# Patient Record
Sex: Female | Born: 1975 | Race: White | Hispanic: No | Marital: Married | Smoking: Former smoker
Health system: Southern US, Community
[De-identification: ages and names within clinical notes are randomized; demographics above are authoritative.]

## PROBLEM LIST (undated history)

## (undated) DIAGNOSIS — N951 Menopausal and female climacteric states: Secondary | ICD-10-CM

## (undated) HISTORY — DX: Menopausal and female climacteric states: N95.1

---
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
## 1998-04-14 ENCOUNTER — Emergency Department (HOSPITAL_COMMUNITY): Admission: EM | Admit: 1998-04-14 | Discharge: 1998-04-14 | Payer: Self-pay | Admitting: Emergency Medicine

## 1998-06-24 ENCOUNTER — Encounter: Admission: RE | Admit: 1998-06-24 | Discharge: 1998-09-22 | Payer: Self-pay | Admitting: Specialist

## 1998-11-18 ENCOUNTER — Ambulatory Visit (HOSPITAL_COMMUNITY): Admission: RE | Admit: 1998-11-18 | Discharge: 1998-11-18 | Payer: Self-pay | Admitting: Orthopedic Surgery

## 1998-11-18 ENCOUNTER — Encounter: Payer: Self-pay | Admitting: Orthopedic Surgery

## 2001-12-04 ENCOUNTER — Other Ambulatory Visit: Admission: RE | Admit: 2001-12-04 | Discharge: 2001-12-04 | Payer: Self-pay | Admitting: Obstetrics and Gynecology

## 2003-01-09 ENCOUNTER — Other Ambulatory Visit: Admission: RE | Admit: 2003-01-09 | Discharge: 2003-01-09 | Payer: Self-pay | Admitting: Obstetrics and Gynecology

## 2004-01-12 ENCOUNTER — Other Ambulatory Visit: Admission: RE | Admit: 2004-01-12 | Discharge: 2004-01-12 | Payer: Self-pay | Admitting: Obstetrics and Gynecology

## 2005-03-15 ENCOUNTER — Other Ambulatory Visit: Admission: RE | Admit: 2005-03-15 | Discharge: 2005-03-15 | Payer: Self-pay | Admitting: Obstetrics and Gynecology

## 2005-11-14 HISTORY — PX: NECK SURGERY: SHX720

## 2006-03-28 ENCOUNTER — Other Ambulatory Visit: Admission: RE | Admit: 2006-03-28 | Discharge: 2006-03-28 | Payer: Self-pay | Admitting: Obstetrics and Gynecology

## 2006-11-12 ENCOUNTER — Inpatient Hospital Stay (HOSPITAL_COMMUNITY): Admission: AD | Admit: 2006-11-12 | Discharge: 2006-11-12 | Payer: Self-pay | Admitting: Obstetrics and Gynecology

## 2006-11-13 ENCOUNTER — Encounter: Admission: RE | Admit: 2006-11-13 | Discharge: 2006-11-13 | Payer: Self-pay | Admitting: Orthopedic Surgery

## 2006-11-22 ENCOUNTER — Inpatient Hospital Stay (HOSPITAL_COMMUNITY): Admission: RE | Admit: 2006-11-22 | Discharge: 2006-11-24 | Payer: Self-pay | Admitting: Neurosurgery

## 2006-12-20 ENCOUNTER — Inpatient Hospital Stay (HOSPITAL_COMMUNITY): Admission: AD | Admit: 2006-12-20 | Discharge: 2006-12-21 | Payer: Self-pay | Admitting: Obstetrics and Gynecology

## 2006-12-29 ENCOUNTER — Inpatient Hospital Stay (HOSPITAL_COMMUNITY): Admission: AD | Admit: 2006-12-29 | Discharge: 2006-12-31 | Payer: Self-pay | Admitting: Obstetrics and Gynecology

## 2007-02-05 ENCOUNTER — Other Ambulatory Visit: Admission: RE | Admit: 2007-02-05 | Discharge: 2007-02-05 | Payer: Self-pay | Admitting: Obstetrics and Gynecology

## 2011-02-15 ENCOUNTER — Inpatient Hospital Stay (HOSPITAL_COMMUNITY)
Admission: AD | Admit: 2011-02-15 | Discharge: 2011-02-17 | DRG: 775 | Disposition: A | Discharge status: Home / Self Care | Payer: PRIVATE HEALTH INSURANCE | Source: Ambulatory Visit | Attending: Obstetrics & Gynecology | Admitting: Obstetrics & Gynecology

## 2011-02-15 DIAGNOSIS — O09529 Supervision of elderly multigravida, unspecified trimester: Secondary | ICD-10-CM | POA: Diagnosis present

## 2011-02-16 LAB — RPR: RPR Ser Ql: NONREACTIVE

## 2011-02-16 LAB — CBC
HCT: 37.4 % (ref 36.0–46.0)
Hemoglobin: 12.4 g/dL (ref 12.0–15.0)
MCH: 28.3 pg (ref 26.0–34.0)
MCHC: 33.2 g/dL (ref 30.0–36.0)
MCV: 85.4 fL (ref 78.0–100.0)
Platelets: 225 10*3/uL (ref 150–400)
RBC: 4.38 MIL/uL (ref 3.87–5.11)
RDW: 14.5 % (ref 11.5–15.5)
WBC: 9.5 10*3/uL (ref 4.0–10.5)

## 2011-02-17 LAB — CBC
MCH: 28 pg (ref 26.0–34.0)
MCHC: 32.1 g/dL (ref 30.0–36.0)
Platelets: 167 10*3/uL (ref 150–400)
RBC: 3.53 MIL/uL — ABNORMAL LOW (ref 3.87–5.11)

## 2012-08-02 ENCOUNTER — Other Ambulatory Visit: Payer: Self-pay | Admitting: Obstetrics and Gynecology

## 2014-01-20 ENCOUNTER — Other Ambulatory Visit (HOSPITAL_COMMUNITY)
Admission: RE | Admit: 2014-01-20 | Discharge: 2014-01-20 | Disposition: A | Discharge status: Home / Self Care | Payer: Commercial Managed Care - PPO | Source: Ambulatory Visit | Attending: Family Medicine | Admitting: Family Medicine

## 2014-01-20 ENCOUNTER — Other Ambulatory Visit: Payer: Self-pay | Admitting: Family Medicine

## 2014-01-20 DIAGNOSIS — Z1151 Encounter for screening for human papillomavirus (HPV): Secondary | ICD-10-CM | POA: Insufficient documentation

## 2014-01-20 DIAGNOSIS — Z01419 Encounter for gynecological examination (general) (routine) without abnormal findings: Secondary | ICD-10-CM | POA: Insufficient documentation

## 2016-02-04 ENCOUNTER — Ambulatory Visit: Payer: Commercial Managed Care - PPO | Admitting: Podiatry

## 2016-02-12 ENCOUNTER — Ambulatory Visit (INDEPENDENT_AMBULATORY_CARE_PROVIDER_SITE_OTHER): Payer: Commercial Managed Care - PPO

## 2016-02-12 ENCOUNTER — Encounter: Payer: Self-pay | Admitting: Podiatry

## 2016-02-12 ENCOUNTER — Ambulatory Visit (INDEPENDENT_AMBULATORY_CARE_PROVIDER_SITE_OTHER): Payer: Commercial Managed Care - PPO | Admitting: Podiatry

## 2016-02-12 ENCOUNTER — Ambulatory Visit: Payer: Self-pay

## 2016-02-12 ENCOUNTER — Other Ambulatory Visit: Payer: Self-pay | Admitting: Podiatry

## 2016-02-12 VITALS — BP 114/76 | HR 71 | Resp 16 | Ht 67.0 in | Wt 193.0 lb

## 2016-02-12 DIAGNOSIS — M25571 Pain in right ankle and joints of right foot: Secondary | ICD-10-CM | POA: Diagnosis not present

## 2016-02-12 DIAGNOSIS — B079 Viral wart, unspecified: Secondary | ICD-10-CM | POA: Diagnosis not present

## 2016-02-12 DIAGNOSIS — B078 Other viral warts: Secondary | ICD-10-CM

## 2016-02-12 DIAGNOSIS — M779 Enthesopathy, unspecified: Secondary | ICD-10-CM

## 2016-02-12 MED ORDER — TRIAMCINOLONE ACETONIDE 10 MG/ML IJ SUSP
10.0000 mg | Freq: Once | INTRAMUSCULAR | Status: AC
  Administered 2016-02-12: 10 mg

## 2016-02-12 NOTE — Progress Notes (Signed)
Subjective:     Patient ID: Christy Cooper, female   DOB: 10-01-1976, 40 y.o.   MRN: [id number]  HPI patient states she's having pain in the right ankle and lesion formation underneath the plantar second metatarsal right that she thinks is a wart from having had 1 previously   Review of Systems  All other systems reviewed and are negative.      Objective:   Physical Exam  Constitutional: She is oriented to person, place, and time.  Cardiovascular: Intact distal pulses.   Musculoskeletal: Normal range of motion.  Neurological: She is oriented to person, place, and time.  Skin: Skin is warm.  Nursing note and vitals reviewed.  neurovascular status intact muscle strength adequate range of motion within normal limits with patient found to have exquisite discomfort in the right ankle sinus tarsi with no loss of motion or inversion injury indications with a keratotic lesion plantar right that upon debridement shows pinpoint bleeding pain to lateral pressure. Patient's found to have good digital perfusion and is well oriented 3     Assessment:     Probable sinus tarsitis right and verruca plantaris plantar right    Plan:     H&P and both conditions reviewed with patient and x-rays reviewed. Today I carefully injected the sinus tarsi right 3 mg Kenalog 5 mg Xylocaine and went ahead and debrided the lesion right exposed the wart and apply chemical consisting of immune agent with sterile dressing. Reappoint to recheck again  X-ray report indicated that there is some spurring around the fibula that no indication of diastases or chronic arthritis of the ankle joint

## 2016-02-12 NOTE — Progress Notes (Signed)
   Subjective:    Patient ID: Christy Cooper, female    DOB: 1975/12/02, 40 y.o.   MRN: [id number]  HPI Patient presents with ankle pain in their right foot; both sides. Pt stated, "When runs about 1 mile, ankle starts to hurt"; x6-8 months.  Patient also presents with a wart on their right foot; plantar forefoot-below 2nd toe; x3-4 months.   Review of Systems  All other systems reviewed and are negative.      Objective:   Physical Exam        Assessment & Plan:

## 2016-03-11 ENCOUNTER — Ambulatory Visit: Payer: Commercial Managed Care - PPO | Admitting: Podiatry

## 2016-05-31 ENCOUNTER — Other Ambulatory Visit: Payer: Self-pay | Admitting: Family Medicine

## 2016-05-31 DIAGNOSIS — Z1231 Encounter for screening mammogram for malignant neoplasm of breast: Secondary | ICD-10-CM

## 2016-06-03 ENCOUNTER — Ambulatory Visit
Admission: RE | Admit: 2016-06-03 | Discharge: 2016-06-03 | Disposition: A | Discharge status: Home / Self Care | Payer: Commercial Managed Care - PPO | Source: Ambulatory Visit | Attending: Family Medicine | Admitting: Family Medicine

## 2016-06-03 DIAGNOSIS — Z1231 Encounter for screening mammogram for malignant neoplasm of breast: Secondary | ICD-10-CM

## 2017-01-13 ENCOUNTER — Ambulatory Visit (INDEPENDENT_AMBULATORY_CARE_PROVIDER_SITE_OTHER): Payer: Commercial Managed Care - PPO | Admitting: Podiatry

## 2017-01-13 ENCOUNTER — Encounter: Payer: Self-pay | Admitting: Podiatry

## 2017-01-13 DIAGNOSIS — M779 Enthesopathy, unspecified: Secondary | ICD-10-CM | POA: Diagnosis not present

## 2017-01-13 MED ORDER — TRIAMCINOLONE ACETONIDE 10 MG/ML IJ SUSP
10.0000 mg | Freq: Once | INTRAMUSCULAR | Status: AC
  Administered 2017-01-13: 10 mg

## 2017-01-15 NOTE — Progress Notes (Signed)
Subjective:     Patient ID: Christy SportsMargot Cooper, female   DOB: 1976/01/24, 41 y.o.   MRN: [id number]  HPI patient presents stating that she is getting pain again in her right ankle and that the medication we used gave her relief for at least 4 months   Review of Systems     Objective:   Physical Exam Neurovascular status intact negative Homans sign was noted with patient found to have exquisite discomfort in the sinus tarsi right with inflammation noted upon inversion    Assessment:     Sinus tarsitis right    Plan:     Instructed on injection therapy which were administered today 3 mg Kenalog 5 mg Xylocaine and gave instructions on reduced activity for several days and reappoint as needed

## 2017-09-01 ENCOUNTER — Telehealth: Payer: Self-pay | Admitting: Podiatry

## 2017-09-01 NOTE — Telephone Encounter (Signed)
Called pt back and left a voicemail to call me back in regards to the message she left this morning regarding medical records. Told her to call me directly at [phone] and that we are in the office until 4:00 pm today and will be back at 8:00 am on Monday.

## 2017-09-01 NOTE — Telephone Encounter (Signed)
Hi Shanda BumpsJessica, this is Financial controllerMargot. You just left a message about releasing my records to me via MyChart. That is okay. If you need to speak to me about anything else, you can reach me at [phone]. Thank you very much.

## 2017-09-01 NOTE — Telephone Encounter (Signed)
I am calling to request a copy of my last two office visit notes. If you can e-mail them to me, that would be great. My e-mail address is [email or web address] or you can call me back at [phone]. Thank you.

## 2018-02-02 ENCOUNTER — Other Ambulatory Visit: Payer: Self-pay | Admitting: Family Medicine

## 2018-02-02 DIAGNOSIS — Z139 Encounter for screening, unspecified: Secondary | ICD-10-CM

## 2018-02-20 ENCOUNTER — Ambulatory Visit: Payer: Commercial Managed Care - PPO

## 2018-03-12 ENCOUNTER — Other Ambulatory Visit: Payer: Self-pay | Admitting: Family Medicine

## 2018-03-12 DIAGNOSIS — N644 Mastodynia: Secondary | ICD-10-CM

## 2018-03-13 ENCOUNTER — Other Ambulatory Visit: Payer: Commercial Managed Care - PPO

## 2018-03-13 ENCOUNTER — Ambulatory Visit: Payer: Commercial Managed Care - PPO

## 2018-06-07 DIAGNOSIS — M25571 Pain in right ankle and joints of right foot: Secondary | ICD-10-CM | POA: Diagnosis not present

## 2018-06-07 DIAGNOSIS — L219 Seborrheic dermatitis, unspecified: Secondary | ICD-10-CM | POA: Diagnosis not present

## 2018-07-24 ENCOUNTER — Ambulatory Visit
Admission: RE | Admit: 2018-07-24 | Discharge: 2018-07-24 | Disposition: A | Discharge status: Home / Self Care | Payer: BLUE CROSS/BLUE SHIELD | Source: Ambulatory Visit | Attending: Family Medicine | Admitting: Family Medicine

## 2018-07-24 ENCOUNTER — Ambulatory Visit: Payer: Commercial Managed Care - PPO

## 2018-07-24 DIAGNOSIS — R922 Inconclusive mammogram: Secondary | ICD-10-CM | POA: Diagnosis not present

## 2018-07-24 DIAGNOSIS — N644 Mastodynia: Secondary | ICD-10-CM

## 2019-01-17 DIAGNOSIS — R509 Fever, unspecified: Secondary | ICD-10-CM | POA: Diagnosis not present

## 2019-01-17 DIAGNOSIS — J069 Acute upper respiratory infection, unspecified: Secondary | ICD-10-CM | POA: Diagnosis not present

## 2019-06-24 DIAGNOSIS — L989 Disorder of the skin and subcutaneous tissue, unspecified: Secondary | ICD-10-CM | POA: Diagnosis not present

## 2019-09-02 DIAGNOSIS — D225 Melanocytic nevi of trunk: Secondary | ICD-10-CM | POA: Diagnosis not present

## 2019-09-02 DIAGNOSIS — D485 Neoplasm of uncertain behavior of skin: Secondary | ICD-10-CM | POA: Diagnosis not present

## 2019-09-02 DIAGNOSIS — L739 Follicular disorder, unspecified: Secondary | ICD-10-CM | POA: Diagnosis not present

## 2019-09-13 DIAGNOSIS — M25531 Pain in right wrist: Secondary | ICD-10-CM | POA: Diagnosis not present

## 2019-09-13 DIAGNOSIS — Z6830 Body mass index (BMI) 30.0-30.9, adult: Secondary | ICD-10-CM | POA: Diagnosis not present

## 2019-09-13 DIAGNOSIS — M25571 Pain in right ankle and joints of right foot: Secondary | ICD-10-CM | POA: Diagnosis not present

## 2019-09-13 DIAGNOSIS — M7541 Impingement syndrome of right shoulder: Secondary | ICD-10-CM | POA: Diagnosis not present

## 2019-09-20 DIAGNOSIS — M25571 Pain in right ankle and joints of right foot: Secondary | ICD-10-CM | POA: Diagnosis not present

## 2020-03-16 ENCOUNTER — Other Ambulatory Visit (HOSPITAL_COMMUNITY)
Admission: RE | Admit: 2020-03-16 | Discharge: 2020-03-16 | Disposition: A | Discharge status: Home / Self Care | Payer: BC Managed Care – PPO | Source: Ambulatory Visit | Attending: Family Medicine | Admitting: Family Medicine

## 2020-03-16 ENCOUNTER — Other Ambulatory Visit: Payer: Self-pay | Admitting: Family Medicine

## 2020-03-16 DIAGNOSIS — Z124 Encounter for screening for malignant neoplasm of cervix: Secondary | ICD-10-CM | POA: Diagnosis not present

## 2020-03-16 DIAGNOSIS — Z0000 Encounter for general adult medical examination without abnormal findings: Secondary | ICD-10-CM | POA: Diagnosis not present

## 2020-03-17 LAB — CYTOLOGY - PAP
Comment: NEGATIVE
Diagnosis: NEGATIVE
High risk HPV: NEGATIVE

## 2020-04-06 DIAGNOSIS — L720 Epidermal cyst: Secondary | ICD-10-CM | POA: Diagnosis not present

## 2020-04-07 DIAGNOSIS — L720 Epidermal cyst: Secondary | ICD-10-CM | POA: Diagnosis not present

## 2020-05-11 DIAGNOSIS — M7918 Myalgia, other site: Secondary | ICD-10-CM | POA: Diagnosis not present

## 2020-08-20 DIAGNOSIS — S90122A Contusion of left lesser toe(s) without damage to nail, initial encounter: Secondary | ICD-10-CM | POA: Diagnosis not present

## 2020-10-01 DIAGNOSIS — N939 Abnormal uterine and vaginal bleeding, unspecified: Secondary | ICD-10-CM | POA: Diagnosis not present

## 2020-10-27 ENCOUNTER — Other Ambulatory Visit: Payer: Self-pay | Admitting: Family Medicine

## 2020-10-27 DIAGNOSIS — Z6826 Body mass index (BMI) 26.0-26.9, adult: Secondary | ICD-10-CM | POA: Diagnosis not present

## 2020-10-27 DIAGNOSIS — F17210 Nicotine dependence, cigarettes, uncomplicated: Secondary | ICD-10-CM | POA: Diagnosis not present

## 2020-10-27 DIAGNOSIS — M25511 Pain in right shoulder: Secondary | ICD-10-CM | POA: Diagnosis not present

## 2020-10-27 DIAGNOSIS — N939 Abnormal uterine and vaginal bleeding, unspecified: Secondary | ICD-10-CM | POA: Diagnosis not present

## 2020-11-20 ENCOUNTER — Other Ambulatory Visit: Payer: BC Managed Care – PPO

## 2020-11-26 ENCOUNTER — Other Ambulatory Visit: Payer: BC Managed Care – PPO

## 2020-12-14 ENCOUNTER — Other Ambulatory Visit: Payer: BC Managed Care – PPO

## 2020-12-28 ENCOUNTER — Ambulatory Visit
Admission: RE | Admit: 2020-12-28 | Discharge: 2020-12-28 | Disposition: A | Discharge status: Home / Self Care | Payer: BC Managed Care – PPO | Source: Ambulatory Visit | Attending: Family Medicine | Admitting: Family Medicine

## 2020-12-28 DIAGNOSIS — N939 Abnormal uterine and vaginal bleeding, unspecified: Secondary | ICD-10-CM

## 2020-12-28 DIAGNOSIS — N888 Other specified noninflammatory disorders of cervix uteri: Secondary | ICD-10-CM | POA: Diagnosis not present

## 2021-03-10 DIAGNOSIS — M7541 Impingement syndrome of right shoulder: Secondary | ICD-10-CM | POA: Diagnosis not present

## 2021-03-10 DIAGNOSIS — F17210 Nicotine dependence, cigarettes, uncomplicated: Secondary | ICD-10-CM | POA: Diagnosis not present

## 2021-03-10 DIAGNOSIS — M542 Cervicalgia: Secondary | ICD-10-CM | POA: Diagnosis not present

## 2021-03-11 DIAGNOSIS — M25511 Pain in right shoulder: Secondary | ICD-10-CM | POA: Diagnosis not present

## 2021-05-12 DIAGNOSIS — L259 Unspecified contact dermatitis, unspecified cause: Secondary | ICD-10-CM | POA: Diagnosis not present

## 2021-10-18 DIAGNOSIS — M7661 Achilles tendinitis, right leg: Secondary | ICD-10-CM | POA: Diagnosis not present

## 2021-11-01 DIAGNOSIS — M79671 Pain in right foot: Secondary | ICD-10-CM | POA: Diagnosis not present

## 2021-12-14 DIAGNOSIS — N951 Menopausal and female climacteric states: Secondary | ICD-10-CM | POA: Diagnosis not present

## 2021-12-20 ENCOUNTER — Other Ambulatory Visit: Payer: Self-pay | Admitting: Family Medicine

## 2021-12-20 DIAGNOSIS — Z1231 Encounter for screening mammogram for malignant neoplasm of breast: Secondary | ICD-10-CM

## 2021-12-22 ENCOUNTER — Ambulatory Visit: Payer: BC Managed Care – PPO

## 2022-01-06 ENCOUNTER — Ambulatory Visit
Admission: RE | Admit: 2022-01-06 | Discharge: 2022-01-06 | Disposition: A | Discharge status: Home / Self Care | Payer: BC Managed Care – PPO | Source: Ambulatory Visit | Attending: Family Medicine | Admitting: Family Medicine

## 2022-01-06 DIAGNOSIS — Z1231 Encounter for screening mammogram for malignant neoplasm of breast: Secondary | ICD-10-CM

## 2022-02-23 DIAGNOSIS — E669 Obesity, unspecified: Secondary | ICD-10-CM | POA: Diagnosis not present

## 2022-02-23 DIAGNOSIS — Z0000 Encounter for general adult medical examination without abnormal findings: Secondary | ICD-10-CM | POA: Diagnosis not present

## 2022-02-23 DIAGNOSIS — N951 Menopausal and female climacteric states: Secondary | ICD-10-CM | POA: Diagnosis not present

## 2022-02-23 DIAGNOSIS — R109 Unspecified abdominal pain: Secondary | ICD-10-CM | POA: Diagnosis not present

## 2022-02-23 DIAGNOSIS — Z13220 Encounter for screening for lipoid disorders: Secondary | ICD-10-CM | POA: Diagnosis not present

## 2022-02-23 DIAGNOSIS — Z309 Encounter for contraceptive management, unspecified: Secondary | ICD-10-CM | POA: Diagnosis not present

## 2022-03-22 IMAGING — MG MM DIGITAL SCREENING BILAT W/ TOMO AND CAD
8 series · 8 of 24 positions shown · non-contrast
Comparison: Previous exam(s).

CLINICAL DATA: Screening.

EXAM:
DIGITAL SCREENING BILATERAL MAMMOGRAM WITH TOMOSYNTHESIS AND CAD
TECHNIQUE: Bilateral screening digital craniocaudal and mediolateral oblique
mammograms were obtained. Bilateral screening digital breast
tomosynthesis was performed. The images were evaluated with
computer-aided detection.

[L MLO synth-2D]
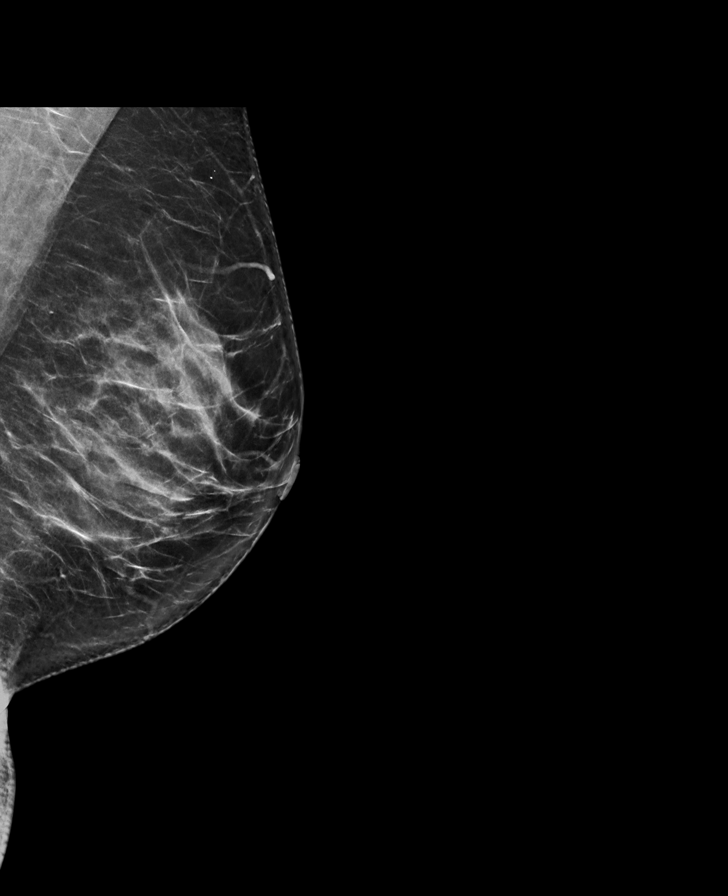

[L CC synth-2D]
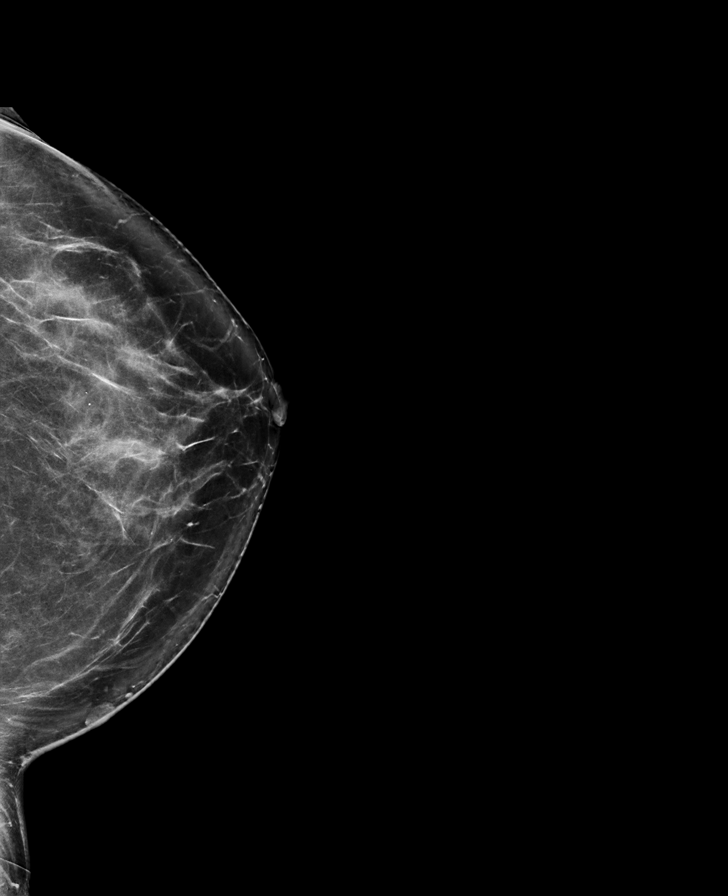

[R MLO synth-2D]
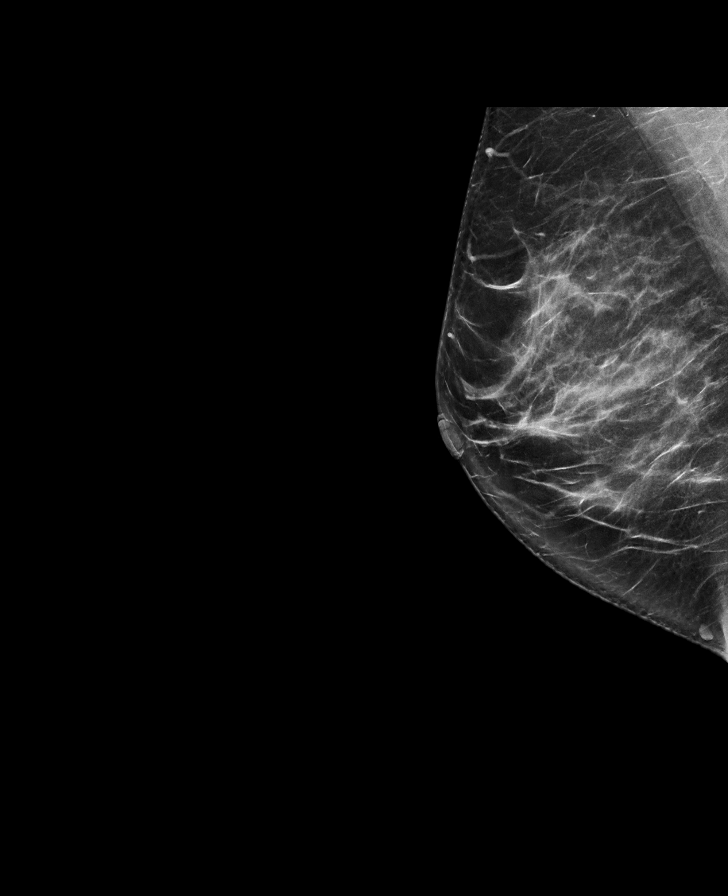

[R CC synth-2D]
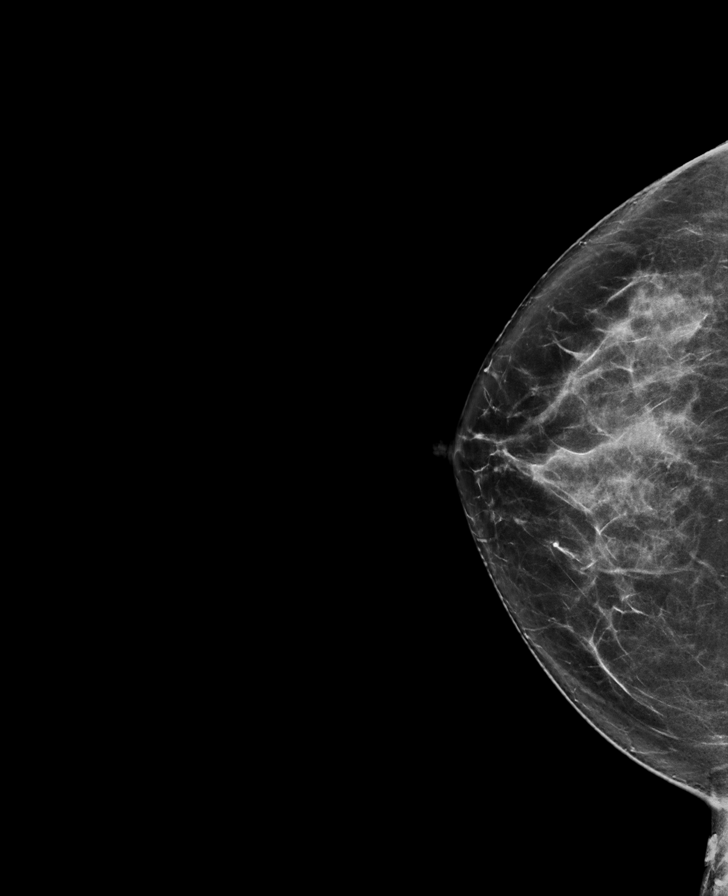

[R MLO tomo · tomo slice 40/79.0]
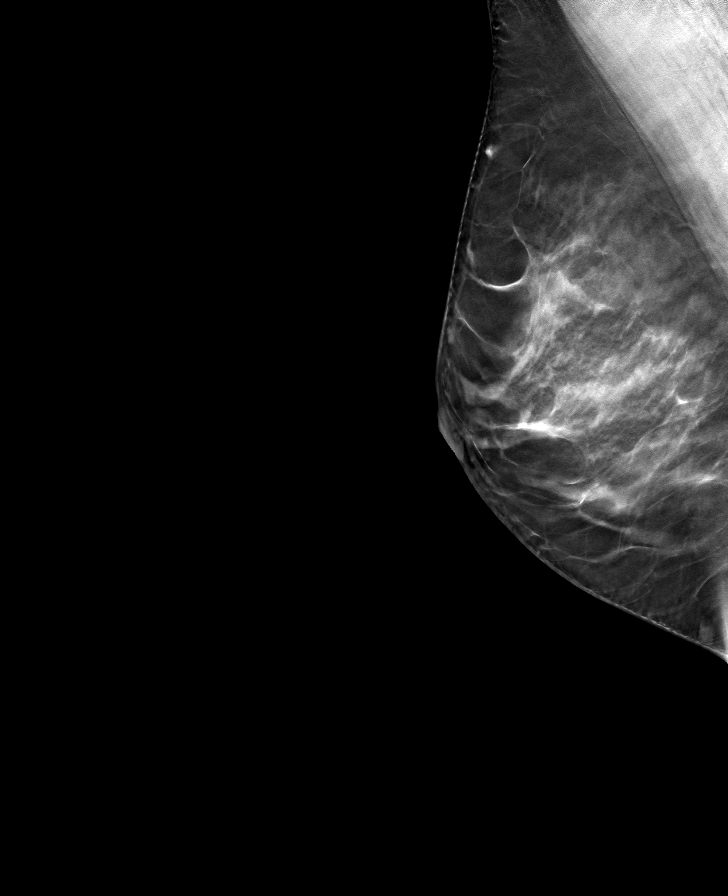

[L CC tomo · tomo slice 42/83.0]
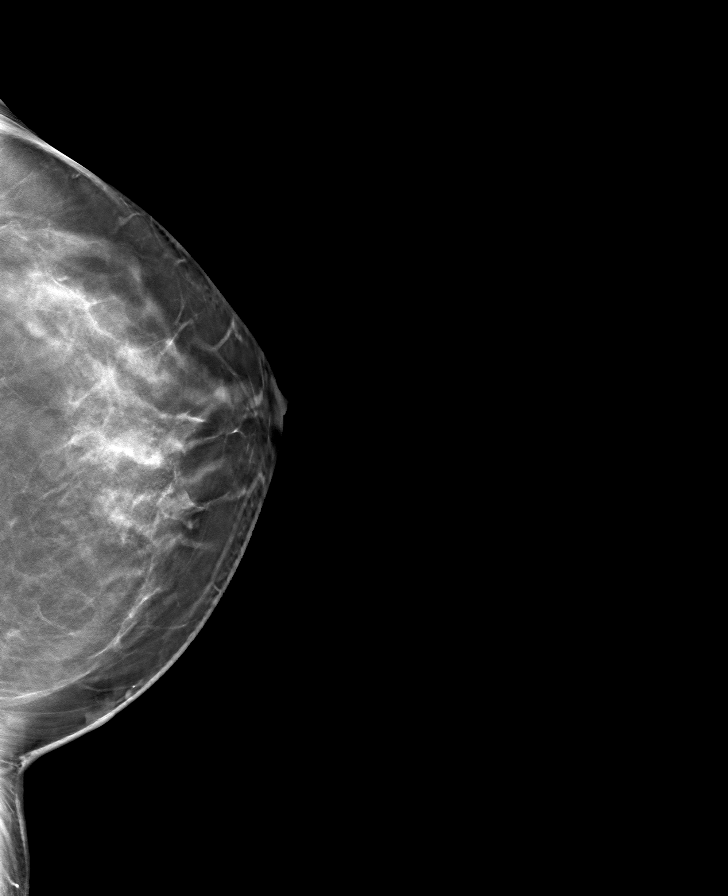

[L MLO tomo · tomo slice 41/82.0]
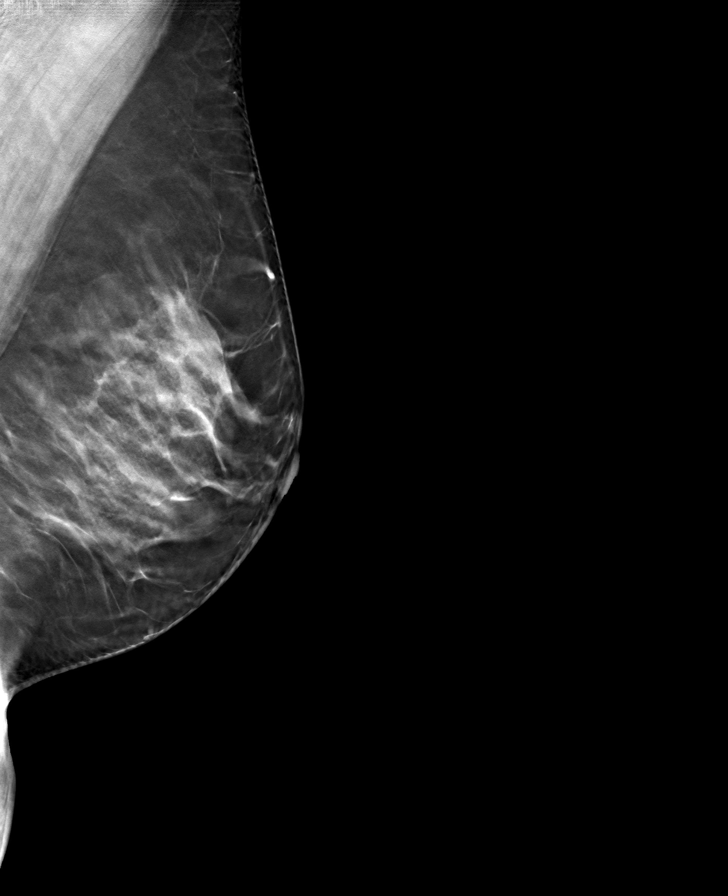

[R CC tomo · tomo slice 39/76.0]
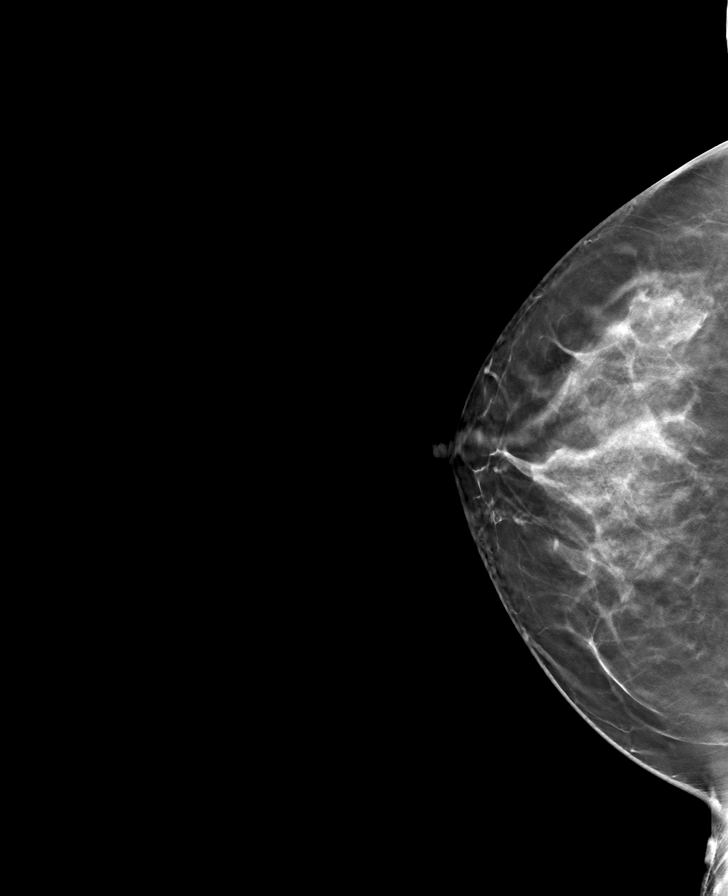

[8 of 24 positions shown; findings below may reference images not displayed]

ACR Breast Density Category c: The breast tissue is heterogeneously
dense, which may obscure small masses.
FINDINGS: There are no findings suspicious for malignancy.
IMPRESSION: No mammographic evidence of malignancy. A result letter of this
screening mammogram will be mailed directly to the patient.

RECOMMENDATION:
Screening mammogram in one year. (Code:[id number]-W-BC3)

BI-RADS CATEGORY  1: Negative.

## 2022-04-18 DIAGNOSIS — Z0289 Encounter for other administrative examinations: Secondary | ICD-10-CM

## 2022-04-19 ENCOUNTER — Encounter (INDEPENDENT_AMBULATORY_CARE_PROVIDER_SITE_OTHER): Payer: Self-pay | Admitting: Family Medicine

## 2022-04-19 ENCOUNTER — Ambulatory Visit (INDEPENDENT_AMBULATORY_CARE_PROVIDER_SITE_OTHER): Payer: BC Managed Care – PPO | Admitting: Family Medicine

## 2022-04-19 VITALS — BP 116/78 | HR 68 | Temp 98.1°F | Ht 67.0 in | Wt 195.0 lb

## 2022-04-19 DIAGNOSIS — R0602 Shortness of breath: Secondary | ICD-10-CM | POA: Diagnosis not present

## 2022-04-19 DIAGNOSIS — Z6830 Body mass index (BMI) 30.0-30.9, adult: Secondary | ICD-10-CM

## 2022-04-19 DIAGNOSIS — E669 Obesity, unspecified: Secondary | ICD-10-CM

## 2022-04-19 DIAGNOSIS — R5383 Other fatigue: Secondary | ICD-10-CM

## 2022-04-19 DIAGNOSIS — F39 Unspecified mood [affective] disorder: Secondary | ICD-10-CM

## 2022-04-19 DIAGNOSIS — E668 Other obesity: Secondary | ICD-10-CM

## 2022-04-19 DIAGNOSIS — Z9189 Other specified personal risk factors, not elsewhere classified: Secondary | ICD-10-CM

## 2022-04-19 DIAGNOSIS — F1010 Alcohol abuse, uncomplicated: Secondary | ICD-10-CM | POA: Diagnosis not present

## 2022-04-20 LAB — COMPREHENSIVE METABOLIC PANEL
ALT: 26 IU/L (ref 0–32)
AST: 30 IU/L (ref 0–40)
Albumin/Globulin Ratio: 2.1 (ref 1.2–2.2)
Albumin: 4.5 g/dL (ref 3.8–4.8)
Alkaline Phosphatase: 75 IU/L (ref 44–121)
BUN/Creatinine Ratio: 14 (ref 9–23)
BUN: 10 mg/dL (ref 6–24)
Bilirubin Total: 0.3 mg/dL (ref 0.0–1.2)
CO2: 21 mmol/L (ref 20–29)
Calcium: 9 mg/dL (ref 8.7–10.2)
Chloride: 104 mmol/L (ref 96–106)
Creatinine, Ser: 0.69 mg/dL (ref 0.57–1.00)
Globulin, Total: 2.1 g/dL (ref 1.5–4.5)
Glucose: 82 mg/dL (ref 70–99)
Potassium: 4.5 mmol/L (ref 3.5–5.2)
Sodium: 141 mmol/L (ref 134–144)
Total Protein: 6.6 g/dL (ref 6.0–8.5)
eGFR: 108 mL/min/{1.73_m2} (ref >59)

## 2022-04-20 LAB — FOLATE: Folate: >20 ng/mL (ref >3.0)

## 2022-04-20 LAB — T4, FREE: Free T4: 1.18 ng/dL (ref 0.82–1.77)

## 2022-04-20 LAB — CBC WITH DIFFERENTIAL/PLATELET
Basophils Absolute: 0 10*3/uL (ref 0.0–0.2)
Basos: 1 %
EOS (ABSOLUTE): 0 10*3/uL (ref 0.0–0.4)
Eos: 1 %
Hematocrit: 39.7 % (ref 34.0–46.6)
Hemoglobin: 13.6 g/dL (ref 11.1–15.9)
Immature Grans (Abs): 0 10*3/uL (ref 0.0–0.1)
Immature Granulocytes: 0 %
Lymphocytes Absolute: 1.3 10*3/uL (ref 0.7–3.1)
Lymphs: 24 %
MCH: 31.3 pg (ref 26.6–33.0)
MCHC: 34.3 g/dL (ref 31.5–35.7)
MCV: 91 fL (ref 79–97)
Monocytes Absolute: 0.4 10*3/uL (ref 0.1–0.9)
Monocytes: 7 %
Neutrophils Absolute: 3.6 10*3/uL (ref 1.4–7.0)
Neutrophils: 67 %
Platelets: 217 10*3/uL (ref 150–450)
RBC: 4.35 x10E6/uL (ref 3.77–5.28)
RDW: 12.9 % (ref 11.7–15.4)
WBC: 5.4 10*3/uL (ref 3.4–10.8)

## 2022-04-20 LAB — LIPID PANEL WITH LDL/HDL RATIO
Cholesterol, Total: 198 mg/dL (ref 100–199)
HDL: 74 mg/dL (ref >39)
LDL Chol Calc (NIH): 114 mg/dL — ABNORMAL HIGH (ref 0–99)
LDL/HDL Ratio: 1.5 ratio (ref 0.0–3.2)
Triglycerides: 56 mg/dL (ref 0–149)
VLDL Cholesterol Cal: 10 mg/dL (ref 5–40)

## 2022-04-20 LAB — VITAMIN B12: Vitamin B-12: 361 pg/mL (ref 232–1245)

## 2022-04-20 LAB — HEMOGLOBIN A1C
Est. average glucose Bld gHb Est-mCnc: 100 mg/dL
Hgb A1c MFr Bld: 5.1 % (ref 4.8–5.6)

## 2022-04-20 LAB — INSULIN, RANDOM: INSULIN: 4.7 u[IU]/mL (ref 2.6–24.9)

## 2022-04-20 LAB — TSH: TSH: 1.75 u[IU]/mL (ref 0.450–4.500)

## 2022-04-20 LAB — VITAMIN D 25 HYDROXY (VIT D DEFICIENCY, FRACTURES): Vit D, 25-Hydroxy: 32.1 ng/mL (ref 30.0–100.0)

## 2022-04-25 NOTE — Progress Notes (Addendum)
Chief Complaint:   OBESITY Christy Cooper (MR# [id number]) is a 46 y.o. female who presents for evaluation and treatment of obesity and related comorbidities. Current BMI is Body mass index is 30.54 kg/m. Christy Cooper has been struggling with her weight for many years and has been unsuccessful in either losing weight, maintaining weight loss, or reaching her healthy weight goal.  Christy Cooper is currently in the action stage of change and ready to dedicate time achieving and maintaining a healthier weight. Christy Cooper is interested in becoming our patient and working on intensive lifestyle modifications including (but not limited to) diet and exercise for weight loss.  Christy Cooper's habits were reviewed today and are as follows: Her family eats meals together, she thinks her family will eat healthier with her, her desired weight loss is 35 lbs, she has been heavy most of her life, she started gaining weight in her 20's, her heaviest weight ever was 219 pounds, she has significant food cravings issues, she skips meals frequently, she is frequently drinking liquids with calories, she frequently makes poor food choices, she has problems with excessive hunger, she frequently eats larger portions than normal, she has binge eating behaviors, and she struggles with emotional eating.  Depression Screen Christy Cooper's Food and Mood (modified PHQ-9) score was 19.     04/19/2022    8:34 AM  Depression screen PHQ 2/9  Decreased Interest 3  Down, Depressed, Hopeless 3  PHQ - 2 Score 6  Altered sleeping 2  Tired, decreased energy 3  Change in appetite 3  Feeling bad or failure about yourself  2  Trouble concentrating 3  Moving slowly or fidgety/restless 0  Suicidal thoughts 0  PHQ-9 Score 19  Difficult doing work/chores Somewhat difficult   Subjective:   1. Other fatigue Christy Cooper admits to daytime somnolence and admits to waking up still tired. Patient has a history of symptoms of daytime fatigue and morning fatigue.  Christy Cooper generally gets 8 hours of sleep per night, and states that she has generally restful sleep. Snoring is not present. Apneic episodes are not present. Epworth Sleepiness Score is 3.   2. SOBOE (shortness of breath on exertion) Christy Cooper notes increasing shortness of breath with exercising and seems to be worsening over time with weight gain. She notes getting out of breath sooner with activity than she used to. This has gotten worse recently. Christy Cooper denies shortness of breath at rest or orthopnea.  3. Excessive drinking alcohol Christy Cooper reports that she drinks 3-6 Truly's a night on average but sometimes more.  4. Mood disorder (HCC)- emotional eating She failed Wellbutrin in the past, as it caused her to feel angry and lock jaw. She also failed Lexapro. Symptoms are worse with pt being premenopausal.  5. At risk for malnutrition Christy Cooper is at risk for malnutrition due to poor diet and alcohol intake.  Assessment/Plan:   Orders Placed This Encounter  Procedures   Vitamin B12   CBC with Differential/Platelet   Comprehensive metabolic panel   Folate   Hemoglobin A1c   Insulin, random   Lipid Panel With LDL/HDL Ratio   T4, free   TSH   VITAMIN D 25 Hydroxy (Vit-D Deficiency, Fractures)   EKG 12-Lead    There are no discontinued medications.   No orders of the defined types were placed in this encounter.    1. Other fatigue Christy Cooper does feel that her weight is causing her energy to be lower than it should be. Fatigue may be  related to obesity, depression or many other causes. Labs will be ordered, and in the meanwhile, Christy Cooper will focus on self care including making healthy food choices, increasing physical activity and focusing on stress reduction. Obtain fasting labs today.  - EKG 12-Lead - Vitamin B12 - CBC with Differential/Platelet - Comprehensive metabolic panel - Hemoglobin A1c - Insulin, random - Lipid Panel With LDL/HDL Ratio - T4, free - TSH - VITAMIN D 25 Hydroxy  (Vit-D Deficiency, Fractures)  2. SOBOE (shortness of breath on exertion) Christy Cooper does feel that she gets out of breath more easily that she used to when she exercises. Christy Cooper's shortness of breath appears to be obesity related and exercise induced. She has agreed to work on weight loss and gradually increase exercise to treat her exercise induced shortness of breath. Will continue to monitor closely. Obtain fasting labs today.  - Comprehensive metabolic panel - Lipid Panel With LDL/HDL Ratio  3. Excessive drinking alcohol Decrease alcohol intake and keep alcohol calories within allotted snack calories.  4. Mood disorder (HCC)- emotional eating Behavior modification techniques were discussed today to help Christy Cooper deal with her emotional/non-hunger eating behaviors.  Orders and follow up as documented in patient record. Handout for local mental health professionals given to pt. She will call to schedule an appointment. Pt is looking for counseling for mood and emotional eating together. She is tearful during OV and struggles with self-esteem issues. Obtain fasting labs today.  - Vitamin B12 - Folate - VITAMIN D 25 Hydroxy (Vit-D Deficiency, Fractures)  5. At risk for malnutrition Christy Cooper was given extensive malnutrition prevention education and counseling today of more than 23 minutes. Counseled her that malnutrition refers to inappropriate nutrients or not the right balance of nutrients for optimal health. Discussed with Christy Cooper that it is absolutely possible to be malnourished but yet obese. Risk factors, including but not limited to, inappropriate dietary choices, difficulty with obtaining food due to physical or financial limitations, and various physical and mental health conditions were reviewed with Christy Cooper.   6. Class 1 obesity with serious comorbidity and body mass index (BMI) of 30.0 to 30.9 in adult, unspecified obesity type Christy Cooper is currently in the action stage of change  and her goal is to continue with weight loss efforts. I recommend Christy Cooper begin the structured treatment plan as follows:  She has agreed to the Category 2 Plan.  Exercise goals:  As is    Behavioral modification strategies: decreasing liquid calories, avoiding temptations, and planning for success.  She was informed of the importance of frequent follow-up visits to maximize her success with intensive lifestyle modifications for her multiple health conditions. She was informed we would discuss her lab results at her next visit unless there is a critical issue that needs to be addressed sooner. Christy Cooper agreed to keep her next visit at the agreed upon time to discuss these results.  Objective:   Blood pressure 116/78, pulse 68, temperature 98.1 F (36.7 C), height 5\' 7"  (1.702 m), weight 195 lb (88.5 kg), SpO2 99 %. Body mass index is 30.54 kg/m.  EKG: Normal sinus rhythm, rate 68.  Indirect Calorimeter completed today shows a VO2 of 265 and a REE of 1829.  Her calculated basal metabolic rate is thus her basal metabolic rate is better than expected.  General: Cooperative, alert, well developed, in no acute distress. HEENT: Conjunctivae and lids unremarkable. Cardiovascular: Regular rhythm.  Lungs: Normal work of breathing. Neurologic: No focal deficits.   Lab  Results  Component Value Date   CREATININE 0.69 04/19/2022   BUN 10 04/19/2022   NA 141 04/19/2022   K 4.5 04/19/2022   CL 104 04/19/2022   CO2 21 04/19/2022   Lab Results  Component Value Date   ALT 26 04/19/2022   AST 30 04/19/2022   ALKPHOS 75 04/19/2022   BILITOT 0.3 04/19/2022   Lab Results  Component Value Date   HGBA1C 5.1 04/19/2022   Lab Results  Component Value Date   INSULIN 4.7 04/19/2022   Lab Results  Component Value Date   TSH 1.750 04/19/2022   Lab Results  Component Value Date   CHOL 198 04/19/2022   HDL 74 04/19/2022   LDLCALC 114 (H) 04/19/2022   TRIG 56 04/19/2022   Lab Results   Component Value Date   WBC 5.4 04/19/2022   HGB 13.6 04/19/2022   HCT 39.7 04/19/2022   MCV 91 04/19/2022   PLT 217 04/19/2022    Attestation Statements:   Reviewed by clinician on day of visit: allergies, medications, problem list, medical history, surgical history, family history, social history, and previous encounter notes.  I, Kyung Ruddamesha Frazier, BS, CMA, am acting as transcriptionist for Marsh & McLennanDeborah Malyn Aytes, DO.  I have reviewed the above documentation for accuracy and completeness, and I agree with the above. Carlye Grippe- Petra Dumler J Kayon Dozier, D.O.  The 21st Century Cures Act was signed into law in 2016 which includes the topic of electronic health records.  This provides immediate access to information in MyChart.  This includes consultation notes, operative notes, office notes, lab results and pathology reports.  If you have any questions about what you read please let us know at your next visit so we can discuss your concerns and take corrective action if need be.  We are right here with you.

## 2022-05-03 ENCOUNTER — Ambulatory Visit (INDEPENDENT_AMBULATORY_CARE_PROVIDER_SITE_OTHER): Payer: BC Managed Care – PPO | Admitting: Family Medicine

## 2022-05-03 ENCOUNTER — Encounter (INDEPENDENT_AMBULATORY_CARE_PROVIDER_SITE_OTHER): Payer: Self-pay | Admitting: Family Medicine

## 2022-05-03 VITALS — BP 126/80 | HR 68 | Temp 97.5°F | Ht 67.0 in | Wt 188.0 lb

## 2022-05-03 DIAGNOSIS — E669 Obesity, unspecified: Secondary | ICD-10-CM | POA: Diagnosis not present

## 2022-05-03 DIAGNOSIS — E538 Deficiency of other specified B group vitamins: Secondary | ICD-10-CM | POA: Diagnosis not present

## 2022-05-03 DIAGNOSIS — Z6830 Body mass index (BMI) 30.0-30.9, adult: Secondary | ICD-10-CM

## 2022-05-03 DIAGNOSIS — Z9189 Other specified personal risk factors, not elsewhere classified: Secondary | ICD-10-CM

## 2022-05-03 DIAGNOSIS — E559 Vitamin D deficiency, unspecified: Secondary | ICD-10-CM

## 2022-05-03 DIAGNOSIS — Z6829 Body mass index (BMI) 29.0-29.9, adult: Secondary | ICD-10-CM

## 2022-05-03 DIAGNOSIS — E7800 Pure hypercholesterolemia, unspecified: Secondary | ICD-10-CM | POA: Diagnosis not present

## 2022-05-03 MED ORDER — VITAMIN D3 50 MCG (2000 UT) PO CAPS: 2000.0000 [IU] | ORAL_CAPSULE | Freq: Every day | ORAL | Status: DC

## 2022-05-03 MED ORDER — CYANOCOBALAMIN 500 MCG PO TABS: ORAL_TABLET | ORAL | Status: AC

## 2022-05-23 ENCOUNTER — Ambulatory Visit (INDEPENDENT_AMBULATORY_CARE_PROVIDER_SITE_OTHER): Payer: BC Managed Care – PPO | Admitting: Nurse Practitioner

## 2022-05-23 ENCOUNTER — Encounter (INDEPENDENT_AMBULATORY_CARE_PROVIDER_SITE_OTHER): Payer: Self-pay | Admitting: Nurse Practitioner

## 2022-05-23 VITALS — BP 131/89 | HR 68 | Temp 98.2°F | Ht 67.0 in | Wt 189.0 lb

## 2022-05-23 DIAGNOSIS — E669 Obesity, unspecified: Secondary | ICD-10-CM

## 2022-05-23 DIAGNOSIS — E559 Vitamin D deficiency, unspecified: Secondary | ICD-10-CM

## 2022-05-23 DIAGNOSIS — Z6829 Body mass index (BMI) 29.0-29.9, adult: Secondary | ICD-10-CM

## 2022-05-24 NOTE — Progress Notes (Signed)
Chief Complaint:   OBESITY Christy Cooper is here to discuss her progress with her obesity treatment plan along with follow-up of her obesity related diagnoses. Christy Cooper is on the Category 2 Plan and states she is following her eating plan approximately 50% of the time. Christy Cooper states she is walking and weight traing 30-45 minutes 2-4 times per week.  Today's visit was #: 3 Starting weight: 195 lbs Starting date: 04/19/2022 Today's weight: 189 lbs Today's date: 05/23/2022 Total lbs lost to date: 6 lbs Total lbs lost since last in-office visit: 0  Interim History: Christy Cooper returned from vacation 2 days ago. She is leaving for [place] July 18th to visit her sister for 2 weeks. She is drinking water with flavor, OCC soda and 2 Truly's daily. She will be in [place], July 18th-Aug 31st.  Subjective:   1. Vitamin D deficiency Christy Cooper is not taking Vit D over the counter as she forgot to start taking it.  Assessment/Plan:   1. Vitamin D deficiency Christy Cooper will take as directed.  2. Obesity, current BMI 29.7 Christy Cooper is currently in the action stage of change. As such, her goal is to continue with weight loss efforts. She has agreed to practicing portion control and making smarter food choices, such as increasing vegetables and decreasing simple carbohydrates.   Exercise goals: As is.  Behavioral modification strategies: increasing lean protein intake, increasing water intake, travel eating strategies, and planning for success.  Christy Cooper has agreed to follow-up with our clinic in 4 weeks. She was informed of the importance of frequent follow-up visits to maximize her success with intensive lifestyle modifications for her multiple health conditions.   Objective:   Blood pressure 131/89, pulse 68, temperature 98.2 F (36.8 C), height 5\' 7"  (1.702 m), weight 189 lb (85.7 kg), SpO2 100 %. Body mass index is 29.6 kg/m.  General: Cooperative, alert, well developed, in no acute distress. HEENT:  Conjunctivae and lids unremarkable. Cardiovascular: Regular rhythm.  Lungs: Normal work of breathing. Neurologic: No focal deficits.   Lab Results  Component Value Date   CREATININE 0.69 04/19/2022   BUN 10 04/19/2022   NA 141 04/19/2022   K 4.5 04/19/2022   CL 104 04/19/2022   CO2 21 04/19/2022   Lab Results  Component Value Date   ALT 26 04/19/2022   AST 30 04/19/2022   ALKPHOS 75 04/19/2022   BILITOT 0.3 04/19/2022   Lab Results  Component Value Date   HGBA1C 5.1 04/19/2022   Lab Results  Component Value Date   INSULIN 4.7 04/19/2022   Lab Results  Component Value Date   TSH 1.750 04/19/2022   Lab Results  Component Value Date   CHOL 198 04/19/2022   HDL 74 04/19/2022   LDLCALC 114 (H) 04/19/2022   TRIG 56 04/19/2022   Lab Results  Component Value Date   VD25OH 32.1 04/19/2022   Lab Results  Component Value Date   WBC 5.4 04/19/2022   HGB 13.6 04/19/2022   HCT 39.7 04/19/2022   MCV 91 04/19/2022   PLT 217 04/19/2022   No results found for: "IRON", "TIBC", "FERRITIN"  Attestation Statements:   Reviewed by clinician on day of visit: allergies, medications, problem list, medical history, surgical history, family history, social history, and previous encounter notes.  Time spent on visit including pre-visit chart review and post-visit care and charting was 30 minutes.   I, Brendell Tyus, RMA, am acting as transcriptionist for 06/19/2022, FNP.  I have reviewed the above  documentation for accuracy and completeness, and I agree with the above. Irene Limbo, FNP

## 2022-06-20 ENCOUNTER — Ambulatory Visit (INDEPENDENT_AMBULATORY_CARE_PROVIDER_SITE_OTHER): Payer: BC Managed Care – PPO | Admitting: Family Medicine

## 2022-06-20 ENCOUNTER — Encounter (INDEPENDENT_AMBULATORY_CARE_PROVIDER_SITE_OTHER): Payer: Self-pay | Admitting: Family Medicine

## 2022-06-20 VITALS — BP 131/86 | HR 68 | Temp 97.9°F | Ht 67.0 in | Wt 187.0 lb

## 2022-06-20 DIAGNOSIS — Z6829 Body mass index (BMI) 29.0-29.9, adult: Secondary | ICD-10-CM

## 2022-06-20 DIAGNOSIS — E538 Deficiency of other specified B group vitamins: Secondary | ICD-10-CM | POA: Diagnosis not present

## 2022-06-20 DIAGNOSIS — E559 Vitamin D deficiency, unspecified: Secondary | ICD-10-CM

## 2022-06-20 DIAGNOSIS — Z9189 Other specified personal risk factors, not elsewhere classified: Secondary | ICD-10-CM

## 2022-06-20 DIAGNOSIS — E669 Obesity, unspecified: Secondary | ICD-10-CM | POA: Diagnosis not present

## 2022-06-20 MED ORDER — VITAMIN D (ERGOCALCIFEROL) 1.25 MG (50000 UNIT) PO CAPS: 50000.0000 [IU] | ORAL_CAPSULE | ORAL | 0 refills | Status: DC

## 2022-06-22 ENCOUNTER — Encounter (INDEPENDENT_AMBULATORY_CARE_PROVIDER_SITE_OTHER): Payer: Self-pay

## 2022-06-25 NOTE — Progress Notes (Unsigned)
Chief Complaint:   OBESITY Christy Cooper is here to discuss her progress with her obesity treatment plan along with follow-up of her obesity related diagnoses. Christy Cooper is on practicing portion control and making smarter food choices, such as increasing vegetables and decreasing simple carbohydrates and states she is following her eating plan approximately 70% of the time. Christy Cooper states she is walking and weight training 30-45 minutes 2-3 times per week.  Today's visit was #: 4 Starting weight: 195 lbs Starting date: 04/19/2022 Today's weight: 187 lbs Today's date: 06/20/2022 Total lbs lost to date: 8 Total lbs lost since last in-office visit: 2  Interim History: Pt decided to quit smoking a week ago and now reports increased cravings . She is using a nicotine patch. Wellbutrin made pt feel angry and crazy in the past.  Subjective:   1. Vitamin D deficiency Pt reports she can't remember to take the OTC Vit D 2000 IU daily.  2. B12 deficiency Christy Cooper reports fatigue.  3. At risk for dehydration Christy Cooper is at risk for dehydration due to inadequate intake.  Assessment/Plan:  No orders of the defined types were placed in this encounter.   Medications Discontinued During This Encounter  Medication Reason   Cholecalciferol (VITAMIN D3) 50 MCG (2000 UT) capsule      Meds ordered this encounter  Medications   Vitamin D, Ergocalciferol, (DRISDOL) 1.25 MG (50000 UNIT) CAPS capsule    Sig: Take 1 capsule (50,000 Units total) by mouth every 7 (seven) days.    Dispense:  4 capsule    Refill:  0     1. Vitamin D deficiency Vit D not at goal of 50. Low Vitamin D level contributes to fatigue and are associated with obesity, breast, and colon cancer. She agrees to start prescription Vitamin D @50 ,000 IU every week and will follow-up for routine testing of Vitamin D, at least 2-3 times per year to avoid over-replacement.  Start- Vitamin D, Ergocalciferol, (DRISDOL) 1.25 MG (50000 UNIT) CAPS  capsule; Take 1 capsule (50,000 Units total) by mouth every 7 (seven) days.  Dispense: 4 capsule; Refill: 0  2. B12 deficiency The diagnosis was reviewed with the patient. Counseling provided today, see below. We will continue to monitor. Orders and follow up as documented in patient record. Start OTC B12 300-500 mcg daily or try calorie free energy drink with B12 in it.  Counseling The body needs vitamin B12: to make red blood cells; to make DNA; and to help the nerves work properly so they can carry messages from the brain to the body.  The main causes of vitamin B12 deficiency include dietary deficiency, digestive diseases, pernicious anemia, and having a surgery in which part of the stomach or small intestine is removed.  Certain medicines can make it harder for the body to absorb vitamin B12. These medicines include: heartburn medications; some antibiotics; some medications used to treat diabetes, gout, and high cholesterol.  In some cases, there are no symptoms of this condition. If the condition leads to anemia or nerve damage, various symptoms can occur, such as weakness or fatigue, shortness of breath, and numbness or tingling in your hands and feet.   Treatment:  May include taking vitamin B12 supplements.  Avoid alcohol.  Eat lots of healthy foods that contain vitamin B12: Beef, pork, chicken, , and organ meats, such as liver.  Seafood: This includes clams, rainbow trout, salmon, tuna, and haddock. Eggs.  Cereal and dairy products that are fortified: This means that  vitamin B12 has been added to the food.   3. At risk for dehydration Christy Cooper is at higher than average risk of dehydration.  Christy Cooper was given more than 15 minutes of proper hydration counseling today.  We discussed the signs and symptoms of dehydration, some of which may include muscle cramping, constipation or even orthostatic symptoms.  Counseling on the prevention of dehydration was also provided today.  Christy Cooper is  at risk for dehydration due to weight loss, lifestyle and behavorial habits and possibly due to taking certain medication(s).  She was encouraged to adequately hydrate and monitor fluid status to avoid dehydration as well as weight loss plateaus.  Unless pre-existing renal or cardiopulmonary conditions exist, in which patient was told to limit their fluid intake, I recommended roughly one half of their weight in pounds to be the approximate ounces of non-caloric, non-caffeinated beverages they should drink per day; including more if they are engaging in exercise.  Christy Cooper is at higher than average risk of dehydration.  Christy Cooper was given more than 9 minutes of proper hydration counseling today.  We discussed the signs and symptoms of dehydration, some of which may include muscle cramping, constipation, or even orthostatic symptoms.  Counseling on the prevention of dehydration was also provided today.  Christy Cooper is at risk for dehydration due to weight loss, lifestyle and behavorial habits, and possibly due to taking certain medication(s).  She was encouraged to adequately hydrate and monitor fluid status to avoid dehydration as well as weight loss plateaus.  Unless pre-existing renal or cardiopulmonary conditions exist, which pt was told to limit their fluid intake.  I recommended roughly one half of their weight in pounds to be the approximate ounces of non-caloric, non-caffeinated beverages they should drink per day; including more if they are engaging in exercise.  4. Obesity, current BMI 29.4 Christy Cooper is currently in the action stage of change. As such, her goal is to continue with weight loss efforts. She has agreed to the Category 2 Plan or practicing portion control and making smarter food choices, such as increasing vegetables and decreasing simple carbohydrates.   Exercise goals:  As is    Behavioral modification strategies: increasing lean protein intake, decreasing simple carbohydrates, and avoiding  temptations.  Christy Cooper has agreed to follow-up with our clinic in 3 weeks. She was informed of the importance of frequent follow-up visits to maximize her success with intensive lifestyle modifications for her multiple health conditions.   Objective:   Blood pressure 131/86, pulse 68, temperature 97.9 F (36.6 C), height 5\' 7"  (1.702 m), weight 187 lb (84.8 kg), SpO2 (!) 10 %. Body mass index is 29.29 kg/m.  General: Cooperative, alert, well developed, in no acute distress. HEENT: Conjunctivae and lids unremarkable. Cardiovascular: Regular rhythm.  Lungs: Normal work of breathing. Neurologic: No focal deficits.   Lab Results  Component Value Date   CREATININE 0.69 04/19/2022   BUN 10 04/19/2022   NA 141 04/19/2022   K 4.5 04/19/2022   CL 104 04/19/2022   CO2 21 04/19/2022   Lab Results  Component Value Date   ALT 26 04/19/2022   AST 30 04/19/2022   ALKPHOS 75 04/19/2022   BILITOT 0.3 04/19/2022   Lab Results  Component Value Date   HGBA1C 5.1 04/19/2022   Lab Results  Component Value Date   INSULIN 4.7 04/19/2022   Lab Results  Component Value Date   TSH 1.750 04/19/2022   Lab Results  Component Value Date   CHOL 198 04/19/2022  HDL 74 04/19/2022   LDLCALC 114 (H) 04/19/2022   TRIG 56 04/19/2022   Lab Results  Component Value Date   VD25OH 32.1 04/19/2022   Lab Results  Component Value Date   WBC 5.4 04/19/2022   HGB 13.6 04/19/2022   HCT 39.7 04/19/2022   MCV 91 04/19/2022   PLT 217 04/19/2022     Attestation Statements:   Reviewed by clinician on day of visit: allergies, medications, problem list, medical history, surgical history, family history, social history, and previous encounter notes.  I, Kathlene November, BS, CMA, am acting as transcriptionist for [employer], DO.  I have reviewed the above documentation for accuracy and completeness, and I agree with the above. Marjory Sneddon, D.O.  The [employer] was signed into  law in 2016 which includes the topic of electronic health records.  This provides immediate access to information in MyChart.  This includes consultation notes, operative notes, office notes, lab results and pathology reports.  If you have any questions about what you read please let us know at your next visit so we can discuss your concerns and take corrective action if need be.  We are right here with you.

## 2022-07-08 DIAGNOSIS — F502 Bulimia nervosa: Secondary | ICD-10-CM | POA: Diagnosis not present

## 2022-07-08 DIAGNOSIS — F900 Attention-deficit hyperactivity disorder, predominantly inattentive type: Secondary | ICD-10-CM | POA: Diagnosis not present

## 2022-07-08 DIAGNOSIS — F411 Generalized anxiety disorder: Secondary | ICD-10-CM | POA: Diagnosis not present

## 2022-07-11 ENCOUNTER — Ambulatory Visit (INDEPENDENT_AMBULATORY_CARE_PROVIDER_SITE_OTHER): Payer: BC Managed Care – PPO | Admitting: Family Medicine

## 2022-07-14 ENCOUNTER — Encounter (INDEPENDENT_AMBULATORY_CARE_PROVIDER_SITE_OTHER): Payer: Self-pay | Admitting: Nurse Practitioner

## 2022-07-14 ENCOUNTER — Ambulatory Visit (INDEPENDENT_AMBULATORY_CARE_PROVIDER_SITE_OTHER): Payer: BC Managed Care – PPO | Admitting: Nurse Practitioner

## 2022-07-14 ENCOUNTER — Other Ambulatory Visit (INDEPENDENT_AMBULATORY_CARE_PROVIDER_SITE_OTHER): Payer: Self-pay | Admitting: Nurse Practitioner

## 2022-07-14 VITALS — BP 125/82 | HR 82 | Temp 98.4°F | Ht 67.0 in | Wt 192.0 lb

## 2022-07-14 DIAGNOSIS — E669 Obesity, unspecified: Secondary | ICD-10-CM | POA: Diagnosis not present

## 2022-07-14 DIAGNOSIS — E538 Deficiency of other specified B group vitamins: Secondary | ICD-10-CM

## 2022-07-14 DIAGNOSIS — R638 Other symptoms and signs concerning food and fluid intake: Secondary | ICD-10-CM | POA: Diagnosis not present

## 2022-07-14 DIAGNOSIS — Z6830 Body mass index (BMI) 30.0-30.9, adult: Secondary | ICD-10-CM

## 2022-07-14 DIAGNOSIS — E559 Vitamin D deficiency, unspecified: Secondary | ICD-10-CM

## 2022-07-14 MED ORDER — TOPIRAMATE 50 MG PO TABS: ORAL_TABLET | ORAL | 0 refills | Status: DC

## 2022-07-18 NOTE — Progress Notes (Signed)
Chief Complaint:   OBESITY Christy Cooper is here to discuss her progress with her obesity treatment plan along with follow-up of her obesity related diagnoses. Christy Cooper is on the Category 2 Plan and states she is following her eating plan approximately 20% of the time. Christy Cooper states she is not excercising.  Today's visit was #: 5 Starting weight: 195 lbs Starting date: 04/19/2022 Today's weight: 192 lbs Today's date: 07/14/2022 Total lbs lost to date: 3 lbs Total lbs lost since last in-office visit: +5 lbs  Interim History: Has felt things have been a little more challenging since she stopped smoking.  Using a nicotine patch.  Has gotten bored with the food.  Does better with breakfast and lunch.  This past week has gotten off track.  Eats out around 5 days per week.  Drinking water with flavoring.  Sometimes a diet soda and wine a few days per week.  She is going to see a therapist every 2 weeks.  Struggles with cravings.   Subjective:   1. Vitamin D deficiency She is currently taking prescription vitamin D 50,000 IU each week. She denies nausea, vomiting or muscle weakness.  2. B12 deficiency Taking Vitamin B12 OTC.  3. Abnormal craving Took Wellbutrin in the past and stopped due to side effects.  Still struggling with cravings.   Assessment/Plan:   1. Vitamin D deficiency Continue Vit D as directed.  Low Vitamin D level contributes to fatigue and are associated with obesity, breast, and colon cancer. She agrees to continue to take prescription Vitamin D @50 ,000 IU every week and will follow-up for routine testing of Vitamin D, at least 2-3 times per year to avoid over-replacement.  2. B12 deficiency Continue to take OTC Vitamin B-12.   3. Abnormal craving Start - topiramate (TOPAMAX) 50 MG tablet; Take 1/2 tablet po daily for one week and then increase to a full pill  Dispense: 30 tablet; Refill: 0 Patient knows not to get pregnant while taking as topamax can cause birth  defects.   4. Obesity, current BMI 30.1 Christy Cooper is currently in the action stage of change. As such, her goal is to continue with weight loss efforts. She has agreed to the Category 2 Plan.   Exercise goals: All adults should avoid inactivity. Some physical activity is better than none, and adults who participate in any amount of physical activity gain some health benefits.  Behavioral modification strategies: increasing lean protein intake, increasing vegetables, and increasing water intake.  Christy Cooper has agreed to follow-up with our clinic in 3 weeks. She was informed of the importance of frequent follow-up visits to maximize her success with intensive lifestyle modifications for her multiple health conditions.   Objective:   Blood pressure 125/82, pulse 82, temperature 98.4 F (36.9 C), height 5\' 7"  (1.702 m), weight 192 lb (87.1 kg), SpO2 98 %. Body mass index is 30.07 kg/m.  General: Cooperative, alert, well developed, in no acute distress. HEENT: Conjunctivae and lids unremarkable. Cardiovascular: Regular rhythm.  Lungs: Normal work of breathing. Neurologic: No focal deficits.   Lab Results  Component Value Date   CREATININE 0.69 04/19/2022   BUN 10 04/19/2022   NA 141 04/19/2022   K 4.5 04/19/2022   CL 104 04/19/2022   CO2 21 04/19/2022   Lab Results  Component Value Date   ALT 26 04/19/2022   AST 30 04/19/2022   ALKPHOS 75 04/19/2022   BILITOT 0.3 04/19/2022   Lab Results  Component Value Date  HGBA1C 5.1 04/19/2022   Lab Results  Component Value Date   INSULIN 4.7 04/19/2022   Lab Results  Component Value Date   TSH 1.750 04/19/2022   Lab Results  Component Value Date   CHOL 198 04/19/2022   HDL 74 04/19/2022   LDLCALC 114 (H) 04/19/2022   TRIG 56 04/19/2022   Lab Results  Component Value Date   VD25OH 32.1 04/19/2022   Lab Results  Component Value Date   WBC 5.4 04/19/2022   HGB 13.6 04/19/2022   HCT 39.7 04/19/2022   MCV 91 04/19/2022    PLT 217 04/19/2022   No results found for: "IRON", "TIBC", "FERRITIN"  Attestation Statements:   Reviewed by clinician on day of visit: allergies, medications, problem list, medical history, surgical history, family history, social history, and previous encounter notes.  I, Malcolm Metro, RMA, am acting as transcriptionist for Irene Limbo, FNP  I have reviewed the above documentation for accuracy and completeness, and I agree with the above. Irene Limbo, FNP

## 2022-07-19 ENCOUNTER — Other Ambulatory Visit (INDEPENDENT_AMBULATORY_CARE_PROVIDER_SITE_OTHER): Payer: Self-pay | Admitting: Family Medicine

## 2022-07-19 DIAGNOSIS — E559 Vitamin D deficiency, unspecified: Secondary | ICD-10-CM

## 2022-08-01 DIAGNOSIS — F900 Attention-deficit hyperactivity disorder, predominantly inattentive type: Secondary | ICD-10-CM | POA: Diagnosis not present

## 2022-08-01 DIAGNOSIS — F411 Generalized anxiety disorder: Secondary | ICD-10-CM | POA: Diagnosis not present

## 2022-08-01 DIAGNOSIS — F502 Bulimia nervosa: Secondary | ICD-10-CM | POA: Diagnosis not present

## 2022-08-02 ENCOUNTER — Telehealth (INDEPENDENT_AMBULATORY_CARE_PROVIDER_SITE_OTHER): Payer: Self-pay | Admitting: Nurse Practitioner

## 2022-08-02 NOTE — Telephone Encounter (Signed)
[facility] [employer]) Calling to get diagnose codes) for this patient [phone]

## 2022-08-02 NOTE — Telephone Encounter (Signed)
Returned call to [facility] to give diagnosis codes for 05-12-22. Diagnostic codes were accepted and with no further questions or concerns.

## 2022-08-03 ENCOUNTER — Ambulatory Visit (INDEPENDENT_AMBULATORY_CARE_PROVIDER_SITE_OTHER): Payer: BC Managed Care – PPO | Admitting: Nurse Practitioner

## 2022-08-03 ENCOUNTER — Encounter (INDEPENDENT_AMBULATORY_CARE_PROVIDER_SITE_OTHER): Payer: Self-pay | Admitting: Nurse Practitioner

## 2022-08-03 VITALS — BP 115/79 | HR 70 | Temp 98.0°F | Ht 67.0 in | Wt 189.0 lb

## 2022-08-03 DIAGNOSIS — R638 Other symptoms and signs concerning food and fluid intake: Secondary | ICD-10-CM

## 2022-08-03 DIAGNOSIS — Z6829 Body mass index (BMI) 29.0-29.9, adult: Secondary | ICD-10-CM | POA: Diagnosis not present

## 2022-08-03 DIAGNOSIS — E559 Vitamin D deficiency, unspecified: Secondary | ICD-10-CM | POA: Diagnosis not present

## 2022-08-03 DIAGNOSIS — E669 Obesity, unspecified: Secondary | ICD-10-CM

## 2022-08-03 MED ORDER — VITAMIN D (ERGOCALCIFEROL) 1.25 MG (50000 UNIT) PO CAPS: 50000.0000 [IU] | ORAL_CAPSULE | ORAL | 0 refills | Status: DC

## 2022-08-03 MED ORDER — TOPIRAMATE 50 MG PO TABS: ORAL_TABLET | ORAL | 0 refills | Status: DC

## 2022-08-04 ENCOUNTER — Encounter: Payer: Self-pay | Admitting: Nurse Practitioner

## 2022-08-04 NOTE — Progress Notes (Signed)
Chief Complaint:   OBESITY Christy Cooper is here to discuss her progress with her obesity treatment plan along with follow-up of her obesity related diagnoses. Christy Cooper is on the Category 2 Plan and states she is following her eating plan approximately 60% of the time. Christy Cooper states she is walking 40 minutes 3-4 times per week.  Today's visit was #: 6 Starting weight: 195 lbs Starting date: 04/19/2022 Today's weight: 189 lbs Today's date: 08/03/2022 Total lbs lost to date: 6 lbs Total lbs lost since last in-office visit: 3  Interim History: Christy Cooper has done well with weight loss since her last visit. Has decreased her wine intake.  Eating out 1-2 days per week. Seeing a therapist on a regular basis. Drinking water with flavoring, diet soda and has started a pre work out drink this week.  Subjective:   1. Abnormal craving Christy Cooper was started on Topamax 50 mg after her last visit. Occasional side effects of sleepiness. Has helped with cravings. Has taken Wellbutrin in the past and stopped due to side effects.   2. Vitamin D deficiency Christy Cooper is currently taking prescription Vit D 50,000 IU once a week.  Denies side effects.  Denies nausea, vomiting or muscle weakness.  Assessment/Plan:   1. Abnormal craving We will refill Topiramate 50 mg daily for 1 month with 0 refills. Side effects discussed.  Patient knows not to get pregnant while taking as Topamax can cause birth defects.  -Refill topiramate (TOPAMAX) 50 MG tablet; Take 1 tablet po daily  Dispense: 30 tablet; Refill: 0  2. Vitamin D deficiency We will refill Vit D 50,000 IU once a week for 1 month with 0 refills. Low Vitamin D level contributes to fatigue and are associated with obesity, breast, and colon cancer. She agrees to continue to take prescription Vitamin D @50 ,000 IU every week and will follow-up for routine testing of Vitamin D, at least 2-3 times per year to avoid over-replacement.   -Refill Vitamin D, Ergocalciferol,  (DRISDOL) 1.25 MG (50000 UNIT) CAPS capsule; Take 1 capsule (50,000 Units total) by mouth every 7 (seven) days.  Dispense: 4 capsule; Refill: 0  3. Obesity, current BMI 29.6 Christy Cooper is currently in the action stage of change. As such, her goal is to continue with weight loss efforts. She has agreed to the Category 2 Plan.   Exercise goals: As is.  Behavioral modification strategies: increasing lean protein intake, increasing water intake, and planning for success.  Christy Cooper has agreed to follow-up with our clinic in 3 weeks. She was informed of the importance of frequent follow-up visits to maximize her success with intensive lifestyle modifications for her multiple health conditions.   Objective:   Blood pressure 115/79, pulse 70, temperature 98 F (36.7 C), height 5\' 7"  (1.702 m), weight 189 lb (85.7 kg), SpO2 100 %. Body mass index is 29.6 kg/m.  General: Cooperative, alert, well developed, in no acute distress. HEENT: Conjunctivae and lids unremarkable. Cardiovascular: Regular rhythm.  Lungs: Normal work of breathing. Neurologic: No focal deficits.   Lab Results  Component Value Date   CREATININE 0.69 04/19/2022   BUN 10 04/19/2022   NA 141 04/19/2022   K 4.5 04/19/2022   CL 104 04/19/2022   CO2 21 04/19/2022   Lab Results  Component Value Date   ALT 26 04/19/2022   AST 30 04/19/2022   ALKPHOS 75 04/19/2022   BILITOT 0.3 04/19/2022   Lab Results  Component Value Date   HGBA1C 5.1 04/19/2022  Lab Results  Component Value Date   INSULIN 4.7 04/19/2022   Lab Results  Component Value Date   TSH 1.750 04/19/2022   Lab Results  Component Value Date   CHOL 198 04/19/2022   HDL 74 04/19/2022   LDLCALC 114 (H) 04/19/2022   TRIG 56 04/19/2022   Lab Results  Component Value Date   VD25OH 32.1 04/19/2022   Lab Results  Component Value Date   WBC 5.4 04/19/2022   HGB 13.6 04/19/2022   HCT 39.7 04/19/2022   MCV 91 04/19/2022   PLT 217 04/19/2022   No results  found for: "IRON", "TIBC", "FERRITIN"  Attestation Statements:   Reviewed by clinician on day of visit: allergies, medications, problem list, medical history, surgical history, family history, social history, and previous encounter notes.  I, Brendell Tyus, RMA, am acting as transcriptionist for Irene Limbo, FNP.  I have reviewed the above documentation for accuracy and completeness, and I agree with the above. Irene Limbo, FNP

## 2022-08-15 DIAGNOSIS — F900 Attention-deficit hyperactivity disorder, predominantly inattentive type: Secondary | ICD-10-CM | POA: Diagnosis not present

## 2022-08-15 DIAGNOSIS — F502 Bulimia nervosa: Secondary | ICD-10-CM | POA: Diagnosis not present

## 2022-08-15 DIAGNOSIS — F411 Generalized anxiety disorder: Secondary | ICD-10-CM | POA: Diagnosis not present

## 2022-08-24 ENCOUNTER — Ambulatory Visit: Payer: BC Managed Care – PPO | Admitting: Nurse Practitioner

## 2022-08-24 ENCOUNTER — Other Ambulatory Visit: Payer: Self-pay | Admitting: Nurse Practitioner

## 2022-08-24 ENCOUNTER — Encounter: Payer: Self-pay | Admitting: Nurse Practitioner

## 2022-08-24 VITALS — BP 127/85 | HR 65 | Temp 97.8°F | Ht 67.0 in | Wt 188.0 lb

## 2022-08-24 DIAGNOSIS — R638 Other symptoms and signs concerning food and fluid intake: Secondary | ICD-10-CM

## 2022-08-24 DIAGNOSIS — E559 Vitamin D deficiency, unspecified: Secondary | ICD-10-CM | POA: Diagnosis not present

## 2022-08-24 DIAGNOSIS — Z6829 Body mass index (BMI) 29.0-29.9, adult: Secondary | ICD-10-CM

## 2022-08-24 DIAGNOSIS — E669 Obesity, unspecified: Secondary | ICD-10-CM

## 2022-08-24 MED ORDER — VITAMIN D (ERGOCALCIFEROL) 1.25 MG (50000 UNIT) PO CAPS: 50000.0000 [IU] | ORAL_CAPSULE | ORAL | 0 refills | Status: DC

## 2022-08-24 MED ORDER — TOPIRAMATE 50 MG PO TABS: ORAL_TABLET | ORAL | 0 refills | Status: DC

## 2022-08-29 NOTE — Progress Notes (Unsigned)
Chief Complaint:   OBESITY Christy Cooper is here to discuss her progress with her obesity treatment plan along with follow-up of her obesity related diagnoses. Christy Cooper is on the Category 2 Plan and states she is following her eating plan approximately 75% of the time. Christy Cooper states she is weight lifting and walking 34 minutes 4 times per week.  Today's visit was #: 7 Starting weight: 195 lbs Starting date: 04/19/2022 Today's weight: 188 lbs Today's date: 08/24/2022 Total lbs lost to date: 7 lbs Total lbs lost since last in-office visit: 1  Interim History: Christy Cooper feels like things are starting to change for her in a good way. Has been doing well with breakfast, lunch and dinner. She is getting bored with choices and is asking for recommendations. She is walking more and lifting weights a couple days per week. She has been limiting wine. Her goal weight: 165 lbs. Drinking water and protein shakes.  Subjective:   1. Abnormal craving Tae is taking Topamax 50 mg. Denies any side effects. Notes since starting Topamax, it helps with cravings. Avoid Wellbutrin due to side effects when taking in the past.  2. Vitamin D deficiency Christy Cooper is currently taking prescription Vit D 50,000 IU once a week. Denies any nausea, vomiting or muscle weakness.  Assessment/Plan:   1. Abnormal craving We will refill Topamax 50 mg daily for 1 month with 0 refills. Side effects discussed.  Knows not to get pregnant while taking as Topamax can cause birth defects.  -Refill topiramate (TOPAMAX) 50 MG tablet; Take 1 tablet po daily  Dispense: 30 tablet; Refill: 0  2. Vitamin D deficiency We will refill Vit D 50,000 IU once a week for 1 month with 0 refills.  Low Vitamin D level contributes to fatigue and are associated with obesity, breast, and colon cancer. She agrees to continue to take prescription Vitamin D @50 ,000 IU every week and will follow-up for routine testing of Vitamin D, at least 2-3 times per year  to avoid over-replacement.   -Refill Vitamin D, Ergocalciferol, (DRISDOL) 1.25 MG (50000 UNIT) CAPS capsule; Take 1 capsule (50,000 Units total) by mouth every 7 (seven) days.  Dispense: 4 capsule; Refill: 0  3. Obesity, current BMI 29.5 Christy Cooper is currently in the action stage of change. As such, her goal is to continue with weight loss efforts. She has agreed to the Category 2 Plan and keeping a food journal and adhering to recommended goals of 1200-1300 calories and 85+ grams of protein.   Exercise goals: As is.  Behavioral modification strategies: meal planning and cooking strategies, better snacking choices, and planning for success.  Christy Cooper has agreed to follow-up with our clinic in 3 weeks. She was informed of the importance of frequent follow-up visits to maximize her success with intensive lifestyle modifications for her multiple health conditions.   Objective:   Blood pressure 127/85, pulse 65, temperature 97.8 F (36.6 C), temperature source Oral, height 5\' 7"  (1.702 m), weight 188 lb (85.3 kg), SpO2 99 %. Body mass index is 29.44 kg/m.  General: Cooperative, alert, well developed, in no acute distress. HEENT: Conjunctivae and lids unremarkable. Cardiovascular: Regular rhythm.  Lungs: Normal work of breathing. Neurologic: No focal deficits.   Lab Results  Component Value Date   CREATININE 0.69 04/19/2022   BUN 10 04/19/2022   NA 141 04/19/2022   K 4.5 04/19/2022   CL 104 04/19/2022   CO2 21 04/19/2022   Lab Results  Component Value Date   ALT  26 04/19/2022   AST 30 04/19/2022   ALKPHOS 75 04/19/2022   BILITOT 0.3 04/19/2022   Lab Results  Component Value Date   HGBA1C 5.1 04/19/2022   Lab Results  Component Value Date   INSULIN 4.7 04/19/2022   Lab Results  Component Value Date   TSH 1.750 04/19/2022   Lab Results  Component Value Date   CHOL 198 04/19/2022   HDL 74 04/19/2022   LDLCALC 114 (H) 04/19/2022   TRIG 56 04/19/2022   Lab Results   Component Value Date   VD25OH 32.1 04/19/2022   Lab Results  Component Value Date   WBC 5.4 04/19/2022   HGB 13.6 04/19/2022   HCT 39.7 04/19/2022   MCV 91 04/19/2022   PLT 217 04/19/2022   No results found for: "IRON", "TIBC", "FERRITIN"  Attestation Statements:   Reviewed by clinician on day of visit: allergies, medications, problem list, medical history, surgical history, family history, social history, and previous encounter notes.  I, Brendell Tyus, RMA, am acting as transcriptionist for Irene Limbo, FNP.  I have reviewed the above documentation for accuracy and completeness, and I agree with the above. Irene Limbo, FNP

## 2022-08-30 DIAGNOSIS — M25511 Pain in right shoulder: Secondary | ICD-10-CM | POA: Diagnosis not present

## 2022-08-31 DIAGNOSIS — M25511 Pain in right shoulder: Secondary | ICD-10-CM | POA: Diagnosis not present

## 2022-09-01 DIAGNOSIS — F900 Attention-deficit hyperactivity disorder, predominantly inattentive type: Secondary | ICD-10-CM | POA: Diagnosis not present

## 2022-09-01 DIAGNOSIS — F411 Generalized anxiety disorder: Secondary | ICD-10-CM | POA: Diagnosis not present

## 2022-09-01 DIAGNOSIS — F502 Bulimia nervosa: Secondary | ICD-10-CM | POA: Diagnosis not present

## 2022-09-07 DIAGNOSIS — M25511 Pain in right shoulder: Secondary | ICD-10-CM | POA: Diagnosis not present

## 2022-09-12 DIAGNOSIS — S300XXA Contusion of lower back and pelvis, initial encounter: Secondary | ICD-10-CM | POA: Diagnosis not present

## 2022-09-13 DIAGNOSIS — F502 Bulimia nervosa: Secondary | ICD-10-CM | POA: Diagnosis not present

## 2022-09-13 DIAGNOSIS — F411 Generalized anxiety disorder: Secondary | ICD-10-CM | POA: Diagnosis not present

## 2022-09-13 DIAGNOSIS — M25511 Pain in right shoulder: Secondary | ICD-10-CM | POA: Diagnosis not present

## 2022-09-13 DIAGNOSIS — F900 Attention-deficit hyperactivity disorder, predominantly inattentive type: Secondary | ICD-10-CM | POA: Diagnosis not present

## 2022-09-14 ENCOUNTER — Encounter: Payer: Self-pay | Admitting: Nurse Practitioner

## 2022-09-15 ENCOUNTER — Ambulatory Visit: Payer: BC Managed Care – PPO | Admitting: Nurse Practitioner

## 2022-09-15 NOTE — Telephone Encounter (Signed)
Please address

## 2022-09-16 DIAGNOSIS — K5730 Diverticulosis of large intestine without perforation or abscess without bleeding: Secondary | ICD-10-CM | POA: Diagnosis not present

## 2022-09-16 DIAGNOSIS — K648 Other hemorrhoids: Secondary | ICD-10-CM | POA: Diagnosis not present

## 2022-09-16 DIAGNOSIS — Z1211 Encounter for screening for malignant neoplasm of colon: Secondary | ICD-10-CM | POA: Diagnosis not present

## 2022-09-16 DIAGNOSIS — K644 Residual hemorrhoidal skin tags: Secondary | ICD-10-CM | POA: Diagnosis not present

## 2022-09-16 DIAGNOSIS — D124 Benign neoplasm of descending colon: Secondary | ICD-10-CM | POA: Diagnosis not present

## 2022-09-20 DIAGNOSIS — M25511 Pain in right shoulder: Secondary | ICD-10-CM | POA: Diagnosis not present

## 2022-09-22 DIAGNOSIS — M25511 Pain in right shoulder: Secondary | ICD-10-CM | POA: Diagnosis not present

## 2022-09-26 ENCOUNTER — Other Ambulatory Visit: Payer: Self-pay | Admitting: Nurse Practitioner

## 2022-09-26 ENCOUNTER — Encounter: Payer: Self-pay | Admitting: Nurse Practitioner

## 2022-09-26 ENCOUNTER — Ambulatory Visit: Payer: BC Managed Care – PPO | Admitting: Nurse Practitioner

## 2022-09-26 VITALS — BP 127/90 | HR 80 | Temp 98.0°F | Ht 67.0 in | Wt 190.0 lb

## 2022-09-26 DIAGNOSIS — E559 Vitamin D deficiency, unspecified: Secondary | ICD-10-CM | POA: Insufficient documentation

## 2022-09-26 DIAGNOSIS — R638 Other symptoms and signs concerning food and fluid intake: Secondary | ICD-10-CM | POA: Diagnosis not present

## 2022-09-26 DIAGNOSIS — F900 Attention-deficit hyperactivity disorder, predominantly inattentive type: Secondary | ICD-10-CM | POA: Diagnosis not present

## 2022-09-26 DIAGNOSIS — E669 Obesity, unspecified: Secondary | ICD-10-CM | POA: Insufficient documentation

## 2022-09-26 DIAGNOSIS — Z6829 Body mass index (BMI) 29.0-29.9, adult: Secondary | ICD-10-CM

## 2022-09-26 DIAGNOSIS — F411 Generalized anxiety disorder: Secondary | ICD-10-CM | POA: Diagnosis not present

## 2022-09-26 DIAGNOSIS — F502 Bulimia nervosa: Secondary | ICD-10-CM | POA: Diagnosis not present

## 2022-09-26 MED ORDER — VITAMIN D (ERGOCALCIFEROL) 1.25 MG (50000 UNIT) PO CAPS: 50000.0000 [IU] | ORAL_CAPSULE | ORAL | 0 refills | Status: DC

## 2022-09-26 MED ORDER — TOPIRAMATE 50 MG PO TABS: ORAL_TABLET | ORAL | 0 refills | Status: DC

## 2022-09-27 DIAGNOSIS — M25511 Pain in right shoulder: Secondary | ICD-10-CM | POA: Diagnosis not present

## 2022-09-29 DIAGNOSIS — M25511 Pain in right shoulder: Secondary | ICD-10-CM | POA: Diagnosis not present

## 2022-09-29 NOTE — Progress Notes (Signed)
Chief Complaint:   OBESITY Christy Cooper is here to discuss her progress with her obesity treatment plan along with follow-up of her obesity related diagnoses. Christy Cooper is on the Category 2 Plan and keeping a food journal and adhering to recommended goals of 1200-1300 calories and 85+ grams of protein and states she is following her eating plan approximately 30% of the time. Christy Cooper states she is walking/weight training 30 minutes 4 times per week.  Today's visit was #: 8 Starting weight: 195 lbs Starting date: 04/19/2022 Today's weight: 190 lbs Today's date: 09/26/2022 Total lbs lost to date: 5 lbs Total lbs lost since last in-office visit: 0  Interim History: [name] notes that some of her bad habits have returned. Denies polyphagia or cravings. Does not feel like she is drinking enough water.   Subjective:   1. Abnormal craving [name] is taking Topamax 50 mg. Denies any side effects. Feels it has helped with cravings and hunger.  2. Vitamin D deficiency Christy Cooper is currently taking prescription Vit D 50,000 IU once a week. Denies any nausea, vomiting or muscle weakness.  Assessment/Plan:   1. Abnormal craving We will refill Topamax 50 mg daily for 1 month with 0 refills. Side effects discussed.  Knows not to get pregnant while taking his Topamax can cause birth defects.  -Refill topiramate (TOPAMAX) 50 MG tablet; Take 1 tablet po daily  Dispense: 30 tablet; Refill: 0  2. Vitamin D deficiency We will refill Vit D 50,000 IU once a week for 1 month with 0 refills. Side effects discussed. Low Vitamin D level contributes to fatigue and are associated with obesity, breast, and colon cancer. She agrees to continue to take prescription Vitamin D @50 ,000 IU every week and will follow-up for routine testing of Vitamin D, at least 2-3 times per year to avoid over-replacement.   -Refill Vitamin D, Ergocalciferol, (DRISDOL) 1.25 MG (50000 UNIT) CAPS capsule; Take 1 capsule (50,000 Units total) by  mouth every 7 (seven) days.  Dispense: 4 capsule; Refill: 0  3. Obesity, current BMI 29.8 Christy Cooper is currently in the action stage of change. As such, her goal is to continue with weight loss efforts. She has agreed to the Category 2 Plan.   Exercise goals: As is  Will obtain IC and labs at next visit.  Behavioral modification strategies: increasing lean protein intake, increasing vegetables, increasing water intake, and holiday eating strategies .  Christy Cooper has agreed to follow-up with our clinic in 4 weeks. She was informed of the importance of frequent follow-up visits to maximize her success with intensive lifestyle modifications for her multiple health conditions.   Objective:   Blood pressure (!) 127/90, pulse 80, temperature 98 F (36.7 C), temperature source Oral, height 5\' 7"  (1.702 m), weight 190 lb (86.2 kg), SpO2 98 %. Body mass index is 29.76 kg/m.  General: Cooperative, alert, well developed, in no acute distress. HEENT: Conjunctivae and lids unremarkable. Cardiovascular: Regular rhythm.  Lungs: Normal work of breathing. Neurologic: No focal deficits.   Lab Results  Component Value Date   CREATININE 0.69 04/19/2022   BUN 10 04/19/2022   NA 141 04/19/2022   K 4.5 04/19/2022   CL 104 04/19/2022   CO2 21 04/19/2022   Lab Results  Component Value Date   ALT 26 04/19/2022   AST 30 04/19/2022   ALKPHOS 75 04/19/2022   BILITOT 0.3 04/19/2022   Lab Results  Component Value Date   HGBA1C 5.1 04/19/2022   Lab Results  Component  Value Date   INSULIN 4.7 04/19/2022   Lab Results  Component Value Date   TSH 1.750 04/19/2022   Lab Results  Component Value Date   CHOL 198 04/19/2022   HDL 74 04/19/2022   LDLCALC 114 (H) 04/19/2022   TRIG 56 04/19/2022   Lab Results  Component Value Date   VD25OH 32.1 04/19/2022   Lab Results  Component Value Date   WBC 5.4 04/19/2022   HGB 13.6 04/19/2022   HCT 39.7 04/19/2022   MCV 91 04/19/2022   PLT 217 04/19/2022    No results found for: "IRON", "TIBC", "FERRITIN"  Attestation Statements:   Reviewed by clinician on day of visit: allergies, medications, problem list, medical history, surgical history, family history, social history, and previous encounter notes.  I, Brendell Tyus, RMA, am acting as transcriptionist for Irene Limbo, FNP.  I have reviewed the above documentation for accuracy and completeness, and I agree with the above. Irene Limbo, FNP

## 2022-10-10 DIAGNOSIS — F411 Generalized anxiety disorder: Secondary | ICD-10-CM | POA: Diagnosis not present

## 2022-10-10 DIAGNOSIS — F900 Attention-deficit hyperactivity disorder, predominantly inattentive type: Secondary | ICD-10-CM | POA: Diagnosis not present

## 2022-10-10 DIAGNOSIS — F502 Bulimia nervosa: Secondary | ICD-10-CM | POA: Diagnosis not present

## 2022-10-11 DIAGNOSIS — M25511 Pain in right shoulder: Secondary | ICD-10-CM | POA: Diagnosis not present

## 2022-10-24 ENCOUNTER — Encounter: Payer: Self-pay | Admitting: Nurse Practitioner

## 2022-10-24 ENCOUNTER — Other Ambulatory Visit: Payer: Self-pay | Admitting: Nurse Practitioner

## 2022-10-24 ENCOUNTER — Ambulatory Visit: Payer: BC Managed Care – PPO | Admitting: Nurse Practitioner

## 2022-10-24 VITALS — BP 126/84 | HR 66 | Temp 97.6°F | Ht 67.0 in | Wt 189.0 lb

## 2022-10-24 DIAGNOSIS — E559 Vitamin D deficiency, unspecified: Secondary | ICD-10-CM | POA: Diagnosis not present

## 2022-10-24 DIAGNOSIS — R7989 Other specified abnormal findings of blood chemistry: Secondary | ICD-10-CM

## 2022-10-24 DIAGNOSIS — R638 Other symptoms and signs concerning food and fluid intake: Secondary | ICD-10-CM

## 2022-10-24 DIAGNOSIS — R0602 Shortness of breath: Secondary | ICD-10-CM | POA: Diagnosis not present

## 2022-10-24 DIAGNOSIS — E782 Mixed hyperlipidemia: Secondary | ICD-10-CM | POA: Diagnosis not present

## 2022-10-24 DIAGNOSIS — Z6829 Body mass index (BMI) 29.0-29.9, adult: Secondary | ICD-10-CM

## 2022-10-24 DIAGNOSIS — E669 Obesity, unspecified: Secondary | ICD-10-CM

## 2022-10-24 MED ORDER — TOPIRAMATE 50 MG PO TABS: ORAL_TABLET | ORAL | 0 refills | Status: DC

## 2022-10-24 MED ORDER — VITAMIN D (ERGOCALCIFEROL) 1.25 MG (50000 UNIT) PO CAPS: 50000.0000 [IU] | ORAL_CAPSULE | ORAL | 0 refills | Status: DC

## 2022-10-24 NOTE — Patient Instructions (Signed)
The 10-year ASCVD risk score (Arnett DK, et al., 2019) is: 0.5%   Values used to calculate the score:     Age: 46 years     Sex: Female     Is Non-Hispanic African American: No     Diabetic: No     Tobacco smoker: No     Systolic Blood Pressure: 126 mmHg     Is BP treated: No     HDL Cholesterol: 74 mg/dL     Total Cholesterol: 198 mg/dL

## 2022-10-25 LAB — COMPREHENSIVE METABOLIC PANEL
ALT: 22 IU/L (ref 0–32)
AST: 25 IU/L (ref 0–40)
Albumin/Globulin Ratio: 1.6 (ref 1.2–2.2)
Albumin: 4.1 g/dL (ref 3.9–4.9)
Alkaline Phosphatase: 69 IU/L (ref 44–121)
BUN/Creatinine Ratio: 19 (ref 9–23)
BUN: 14 mg/dL (ref 6–24)
Bilirubin Total: 0.4 mg/dL (ref 0.0–1.2)
CO2: 22 mmol/L (ref 20–29)
Calcium: 9.4 mg/dL (ref 8.7–10.2)
Chloride: 103 mmol/L (ref 96–106)
Creatinine, Ser: 0.74 mg/dL (ref 0.57–1.00)
Globulin, Total: 2.6 g/dL (ref 1.5–4.5)
Glucose: 91 mg/dL (ref 70–99)
Potassium: 5.1 mmol/L (ref 3.5–5.2)
Sodium: 139 mmol/L (ref 134–144)
Total Protein: 6.7 g/dL (ref 6.0–8.5)
eGFR: 101 mL/min/{1.73_m2} (ref >59)

## 2022-10-25 LAB — LIPID PANEL WITH LDL/HDL RATIO
Cholesterol, Total: 204 mg/dL — ABNORMAL HIGH (ref 100–199)
HDL: 71 mg/dL (ref >39)
LDL Chol Calc (NIH): 122 mg/dL — ABNORMAL HIGH (ref 0–99)
LDL/HDL Ratio: 1.7 ratio (ref 0.0–3.2)
Triglycerides: 61 mg/dL (ref 0–149)
VLDL Cholesterol Cal: 11 mg/dL (ref 5–40)

## 2022-10-25 LAB — VITAMIN B12: Vitamin B-12: 595 pg/mL (ref 232–1245)

## 2022-10-25 LAB — VITAMIN D 25 HYDROXY (VIT D DEFICIENCY, FRACTURES): Vit D, 25-Hydroxy: 38 ng/mL (ref 30.0–100.0)

## 2022-10-27 ENCOUNTER — Other Ambulatory Visit: Payer: Self-pay | Admitting: Sports Medicine

## 2022-10-27 DIAGNOSIS — M25511 Pain in right shoulder: Secondary | ICD-10-CM

## 2022-11-01 DIAGNOSIS — F411 Generalized anxiety disorder: Secondary | ICD-10-CM | POA: Diagnosis not present

## 2022-11-01 DIAGNOSIS — F900 Attention-deficit hyperactivity disorder, predominantly inattentive type: Secondary | ICD-10-CM | POA: Diagnosis not present

## 2022-11-01 DIAGNOSIS — F502 Bulimia nervosa: Secondary | ICD-10-CM | POA: Diagnosis not present

## 2022-11-08 NOTE — Progress Notes (Unsigned)
Chief Complaint:   OBESITY Christy Cooper is here to discuss her progress with her obesity treatment plan along with follow-up of her obesity related diagnoses. Christy Cooper is on the Category 2 Plan and states she is following her eating plan approximately 50% of the time. Christy Cooper states she is walking/weight lifting 30-60 minutes 4 times per week.  Today's visit was #: 9 Starting weight: 195 lbs Starting date: 04/19/2022 Today's weight: 189 lbs Today's date: 10/24/2022 Total lbs lost to date: 6 lbs Total lbs lost since last in-office visit: 1  Interim History: Christy Cooper has gotten off track since her last visit due to the holidays. Has a couple of celebrations since her last visit. Lose it app:  Calories 1300 + work out calories. Protein: unsure. Drinking water daily.  Subjective:   1. SOBOE (shortness of breath on exertion) History: 30 years of smoking. Stopped smoking Aug 2023. IC today: 1771. Last IC 04/19/22: 1829.  2. Low vitamin B12 level Christy Cooper is taking over the counter Vit B12 2 days per week. Reports fatigue.  3. Vitamin D deficiency Christy Cooper is currently taking prescription Vit D 50,000 IU once a week. Denies any nausea, vomiting or muscle weakness.  4. Abnormal craving Christy Cooper is taking Topamax 50 mg every night. Denies any side effects. Does help with cravings.  5. Mixed hyperlipidemia Never been on medications. Positive FH of hyperlipidemia.    Assessment/Plan:   1. SOBOE (shortness of breath on exertion) IC today 1771.  Increase water, protein and activity.    2. Low vitamin B12 level We will obtain labs today.  - Vitamin B12 - Comprehensive metabolic panel  3. Vitamin D deficiency We will obtain labs today. We will refill Vit D 50K IU once a week for 1 month with 0 refills.  -Refill Vitamin D, Ergocalciferol, (DRISDOL) 1.25 MG (50000 UNIT) CAPS capsule; Take 1 capsule (50,000 Units total) by mouth every 7 (seven) days.  Dispense: 4 capsule; Refill: 0  - VITAMIN D 25  Hydroxy (Vit-D Deficiency, Fractures) - Comprehensive metabolic panel  4. Abnormal craving We will obtain labs today. We will refill Topamax 50 mg daily for 1 month with 0 refills.  -Refill topiramate (TOPAMAX) 50 MG tablet; Take 1 tablet po daily  Dispense: 30 tablet; Refill: 0  - Comprehensive metabolic panel  5. Mixed hyperlipidemia We will obtain labs today. ASCVD reviewed with patient today.  The 10-year ASCVD risk score (Arnett DK, et al., 2019) is: 0.5%   Values used to calculate the score:     Age: 46 years     Sex: Female     Is Non-Hispanic African American: No     Diabetic: No     Tobacco smoker: No     Systolic Blood Pressure: 126 mmHg     Is BP treated: No     HDL Cholesterol: 74 mg/dL     Total Cholesterol: 198 mg/dL  Cardiovascular risk and specific lipid/LDL goals reviewed.  We discussed several lifestyle modifications today and Christy Cooper will continue to work on diet, exercise and weight loss efforts. Orders and follow up as documented in patient record.   Counseling Intensive lifestyle modifications are the first line treatment for this issue. Dietary changes: Increase soluble fiber. Decrease simple carbohydrates. Exercise changes: Moderate to vigorous-intensity aerobic activity 150 minutes per week if tolerated. Lipid-lowering medications: see documented in medical record.   - Lipid Panel With LDL/HDL Ratio - Comprehensive metabolic panel  6. Obesity, current BMI 29.7 Christy Cooper is currently in  the action stage of change. As such, her goal is to continue with weight loss efforts. She has agreed to keeping a food journal and adhering to recommended goals of 1300 calories and 90+ grams of protein.   Exercise goals: As is.  Behavioral modification strategies: increasing lean protein intake, increasing water intake, and holiday eating strategies .  Christy Cooper has agreed to follow-up with our clinic in 6 weeks. She was informed of the importance of frequent follow-up  visits to maximize her success with intensive lifestyle modifications for her multiple health conditions.   Christy Cooper was informed we would discuss her lab results at her next visit unless there is a critical issue that needs to be addressed sooner. Christy Cooper agreed to keep her next visit at the agreed upon time to discuss these results.  Objective:   Blood pressure 126/84, pulse 66, temperature 97.6 F (36.4 C), temperature source Oral, height 5\' 7"  (1.702 m), weight 189 lb (85.7 kg), SpO2 100 %. Body mass index is 29.6 kg/m.  General: Cooperative, alert, well developed, in no acute distress. HEENT: Conjunctivae and lids unremarkable. Cardiovascular: Regular rhythm.  Lungs: Normal work of breathing. Neurologic: No focal deficits.   Lab Results  Component Value Date   CREATININE 0.74 10/24/2022   BUN 14 10/24/2022   NA 139 10/24/2022   K 5.1 10/24/2022   CL 103 10/24/2022   CO2 22 10/24/2022   Lab Results  Component Value Date   ALT 22 10/24/2022   AST 25 10/24/2022   ALKPHOS 69 10/24/2022   BILITOT 0.4 10/24/2022   Lab Results  Component Value Date   HGBA1C 5.1 04/19/2022   Lab Results  Component Value Date   INSULIN 4.7 04/19/2022   Lab Results  Component Value Date   TSH 1.750 04/19/2022   Lab Results  Component Value Date   CHOL 204 (H) 10/24/2022   HDL 71 10/24/2022   LDLCALC 122 (H) 10/24/2022   TRIG 61 10/24/2022   Lab Results  Component Value Date   VD25OH 38.0 10/24/2022   VD25OH 32.1 04/19/2022   Lab Results  Component Value Date   WBC 5.4 04/19/2022   HGB 13.6 04/19/2022   HCT 39.7 04/19/2022   MCV 91 04/19/2022   PLT 217 04/19/2022   No results found for: "IRON", "TIBC", "FERRITIN"  Attestation Statements:   Reviewed by clinician on day of visit: allergies, medications, problem list, medical history, surgical history, family history, social history, and previous encounter notes.  I, Brendell Tyus, RMA, am acting as transcriptionist for  06/19/2022, FNP.  I have reviewed the above documentation for accuracy and completeness, and I agree with the above. Irene Limbo, FNP

## 2022-11-22 ENCOUNTER — Ambulatory Visit
Admission: RE | Admit: 2022-11-22 | Discharge: 2022-11-22 | Disposition: A | Discharge status: Home / Self Care | Payer: BC Managed Care – PPO | Source: Ambulatory Visit | Attending: Sports Medicine | Admitting: Sports Medicine

## 2022-11-22 DIAGNOSIS — M25511 Pain in right shoulder: Secondary | ICD-10-CM

## 2022-12-05 ENCOUNTER — Other Ambulatory Visit: Payer: Self-pay | Admitting: Family Medicine

## 2022-12-05 ENCOUNTER — Encounter: Payer: Self-pay | Admitting: Nurse Practitioner

## 2022-12-05 ENCOUNTER — Ambulatory Visit: Payer: BC Managed Care – PPO | Admitting: Nurse Practitioner

## 2022-12-05 VITALS — BP 121/81 | HR 77 | Temp 97.9°F | Ht 67.0 in

## 2022-12-05 DIAGNOSIS — E559 Vitamin D deficiency, unspecified: Secondary | ICD-10-CM | POA: Diagnosis not present

## 2022-12-05 DIAGNOSIS — E669 Obesity, unspecified: Secondary | ICD-10-CM | POA: Diagnosis not present

## 2022-12-05 DIAGNOSIS — R638 Other symptoms and signs concerning food and fluid intake: Secondary | ICD-10-CM

## 2022-12-05 DIAGNOSIS — Z6830 Body mass index (BMI) 30.0-30.9, adult: Secondary | ICD-10-CM | POA: Diagnosis not present

## 2022-12-05 DIAGNOSIS — Z1231 Encounter for screening mammogram for malignant neoplasm of breast: Secondary | ICD-10-CM

## 2022-12-05 MED ORDER — VITAMIN D (ERGOCALCIFEROL) 1.25 MG (50000 UNIT) PO CAPS: 50000.0000 [IU] | ORAL_CAPSULE | ORAL | 0 refills | Status: DC

## 2022-12-05 MED ORDER — ZEPBOUND 2.5 MG/0.5ML ~~LOC~~ SOAJ: 2.5000 mg | SUBCUTANEOUS | 0 refills | Status: AC

## 2022-12-05 NOTE — Patient Instructions (Addendum)
What is a GLP-1 Glucagon like peptide-1 (GLP-1) agonists represent a class of medications used to treat type 2 diabetes mellitus and obesity.  GLP-1 medications mimic the action of a hormone called glucagon like peptide 1.  When blood sugar levels start to rise/increase these drugs stimulate the body to produce more insulin.  When that happens, the extra insulin helps to lower the blood sugar levels in the body.  This in returns helps with decreasing cravings.  These medications also slow the movement of food from the stomach into the small intestine.  This in return helps one to full faster and longer.    Diabetic medications: Approved for treatment of diabetes mellitus but does not have full approval for weight loss use Victoza (liraglutide) Ozempic (semaglutide) Mounjaro Trulicity Rybelsus  Weight loss medications: Approved for long-term weight loss use.        Saxenda (liraglutide) Wegovy (semaglutide) Zepbound  Contraindications:  Pancreatitis (active gallstones) Medullary thyroid cancer High triglycerides (>500)-will need labs prior to starting Multiple Endocrine Neoplasia syndrome type 2 (MEN 2) Trying to get pregnant Breastfeeding Use with caution with taking insulin or sulfonylureas (will need to monitor blood sugars for hypoglycemia) Side effects (most common): Most common side effects are nausea, gas, bloating and constipation.  Other possible side effects are headaches, belching, diarrhea, tiredness (fatigue), vomiting, upset stomach, dizziness, heartburn and stomach (abdominal pain).  If you think that you are becoming dehydrated, please inform our office or your primary family provider.  Stop immediately and go to ER if you have any symptoms of a serious allergic reaction including swelling of your face, lips, tongue or throat; problems breathing or swallowing; severe rash or itching; fainting or feeling dizzy; or very rapid heart rate.      What is Qsymia and how does it  work?  Qsymia is a prescription only medicine to help with your weight loss. It is a combination of two medicines that are low dose, long-acting: Phentermine & Topiramate. Qsymia contains low dose Phentermine which is a stimulant medicine that could affect your heart rate and blood pressure Qsymia is designed to help you feel satisfied faster that will help you to decrease portion size. Also, it helps curb late night snacking habits. Some food you usually enjoy may start to taste differently which will help you make healthier food choices.  This medicine will be most effective when combined with a reduced calorie diet and physical activity.  How should I take Qsymia? Take daily in the morning with breakfast. Swallow the extended-release capsule whole. Do not crush, break, or chew it.  If you miss a dose, take it as soon as possible. If it is after 12pm, skip the missed dose and go back to your regular dosing schedule. Do not take extra medicine to make up for the missed dose. You have received two separate prescriptions today. You will initially take a lower dose of 3.75mg /23mg  for 14 days then increase to a higher dose of 7.5mg /46mg  for maintenance for 30 days. There are 4 total dosing options. Your provider will discuss any need to go up to a higher dose during your office visits.  If you are taking Levothyroxine, take the Levothyroxine 1 hours before breakfast and the take the Qsymia 1 hour after breakfast. Do not stop taking Qsymia without talking to your provider. Stopping Qsymia suddenly can cause serious side effects, such as seizures and headaches.   What should I avoid while taking Qsymia? Limit caffeine to 1 small cup daily.  Examples are soda, coffee, tea, herbal tea, energy drinks, and chocolate Avoid decongestant medicines like Sudafed, Mucinex-D, and Zyrtec-D. Qsymia may cause you to feel dizzy, drowsy, or confused, or to have trouble thinking or speaking. Do not drive or do  anything else that could be dangerous until you know how this medicine affects you.  Women who can become pregnant: Use effective birth control (contraception) consistently while taking Qsymia. If you miss a menstrual period, STOP Qsymia and call our office immediately. Pregnancy tests will be performed at your appointment if indicated.  What side effects may I notice when taking Qsymia? Side effects that usually do not require medical attention (report to our office if they continue or are bothersome): Dry mouth (drink at least 64 oz of fluid daily) Constipation (you may take over the counter laxative if needed) Metallic taste in your mouth when drinking carbonated beverages Numbness or tingling in the hands, arms, feet or face that lasts more than a week Headache Sudden changes in vision Mental fuzziness (problems with concentration, attention, memory or speech) Trouble sleeping (insomnia) Side effects that you should report to our office as soon as possible: Increases in heart rate and/or palpitations (feeling like your heart is racing or pounding in your chest that lasts several minutes) Chest pain Increased blood pressure Dizziness or feeling faint Shortness of breath Irritability Feeling anxious, agitated, restless, or nervous Depression or severe changes in mood Problems urinating Unusual swelling of the legs Vomiting   Other important information You will be asked to sign an informed consent prior to starting Qysmia Qsymia is a federally controlled substance. Keep Qsymia in a safe place to prevent misuse and abuse. Selling or giving away Qsymia may harm others, and is against the law.  Your prescription will be sent to the pharmacy during your visit.  Refills will require an office visit. Your insurance may not cover the cost of this medicine. Our office will complete a pre-authorization if required by your insurance.

## 2022-12-12 NOTE — Progress Notes (Unsigned)
Chief Complaint:   OBESITY Christy Cooper is here to discuss her progress with her obesity treatment plan along with follow-up of her obesity related diagnoses. Christy Cooper is on the Category 2 Plan and states she is following her eating plan approximately 60% of the time. Christy Cooper states she is weight lifting 30 minutes 2 times per week.  Today's visit was #: 10 Starting weight: 195 lbs Starting date: 04/19/2022 Today's weight: 192 lbs Today's date: 12/05/2022 Total lbs lost to date: 3 lbs Total lbs lost since last in-office visit: 0  Interim History: Christy Cooper celebrated Christmas and New Year's since her last visit.  Struggling with cravings, bored eating and stress eating.  Subjective:   1. Vitamin D deficiency Christy Cooper is currently taking prescription Vit D 50,000 IU once a week.  Denies any nausea, vomiting or muscle weakness.  2. Abnormal craving Christy Cooper is taking Topamax 50 mg.  Denies any side effects.  Does not feel it has helped with cravings.  Assessment/Plan:   1. Vitamin D deficiency We will refill Vit D 50K IU once a week for 1 month with 0 refills.  Side effects discussed.   -Refill Vitamin D, Ergocalciferol, (DRISDOL) 1.25 MG (50000 UNIT) CAPS capsule; Take 1 capsule (50,000 Units total) by mouth every 7 (seven) days.  Dispense: 4 capsule; Refill: 0  2. Abnormal craving Options discussed.  1. Stop Topamax.  2. Increase Topamax to twice a day.  3. Avoid Wellbutrin due to side effects of mood changes and***.  Discontinue Topamax  3. Obesity, current BMI 30.1 Start Zepbound 2.5 mg SQ once weekly for 1 month with 0 refills.  Side effects discussed.   -Start tirzepatide (ZEPBOUND) 2.5 MG/0.5ML Pen; Inject 2.5 mg into the skin once a week.  Dispense: 2 mL; Refill: 0  Christy Cooper is currently in the action stage of change. As such, her goal is to continue with weight loss efforts. She has agreed to the Category 2 Plan.   Exercise goals: All adults should avoid inactivity. Some  physical activity is better than none, and adults who participate in any amount of physical activity gain some health benefits.  Behavioral modification strategies: increasing lean protein intake, increasing vegetables, and increasing water intake.  Christy Cooper has agreed to follow-up with our clinic in 4 weeks. She was informed of the importance of frequent follow-up visits to maximize her success with intensive lifestyle modifications for her multiple health conditions.   Objective:   Blood pressure 121/81, pulse 77, temperature 97.9 F (36.6 C), height 5\' 7"  (1.702 m), SpO2 90 %. Body mass index is 29.6 kg/m.  General: Cooperative, alert, well developed, in no acute distress. HEENT: Conjunctivae and lids unremarkable. Cardiovascular: Regular rhythm.  Lungs: Normal work of breathing. Neurologic: No focal deficits.   Lab Results  Component Value Date   CREATININE 0.74 10/24/2022   BUN 14 10/24/2022   NA 139 10/24/2022   K 5.1 10/24/2022   CL 103 10/24/2022   CO2 22 10/24/2022   Lab Results  Component Value Date   ALT 22 10/24/2022   AST 25 10/24/2022   ALKPHOS 69 10/24/2022   BILITOT 0.4 10/24/2022   Lab Results  Component Value Date   HGBA1C 5.1 04/19/2022   Lab Results  Component Value Date   INSULIN 4.7 04/19/2022   Lab Results  Component Value Date   TSH 1.750 04/19/2022   Lab Results  Component Value Date   CHOL 204 (H) 10/24/2022   HDL 71 10/24/2022   [place]  122 (H) 10/24/2022   TRIG 61 10/24/2022   Lab Results  Component Value Date   VD25OH 38.0 10/24/2022   VD25OH 32.1 04/19/2022   Lab Results  Component Value Date   WBC 5.4 04/19/2022   HGB 13.6 04/19/2022   HCT 39.7 04/19/2022   MCV 91 04/19/2022   PLT 217 04/19/2022   No results found for: "IRON", "TIBC", "FERRITIN"  Attestation Statements:   Reviewed by clinician on day of visit: allergies, medications, problem list, medical history, surgical history, family history, social history, and  previous encounter notes.  I, Brendell Tyus, RMA, am acting as transcriptionist for Everardo Pacific, FNP.  I have reviewed the above documentation for accuracy and completeness, and I agree with the above. -  ***

## 2022-12-14 DIAGNOSIS — F411 Generalized anxiety disorder: Secondary | ICD-10-CM | POA: Diagnosis not present

## 2022-12-14 DIAGNOSIS — F502 Bulimia nervosa: Secondary | ICD-10-CM | POA: Diagnosis not present

## 2022-12-14 DIAGNOSIS — F900 Attention-deficit hyperactivity disorder, predominantly inattentive type: Secondary | ICD-10-CM | POA: Diagnosis not present

## 2022-12-16 ENCOUNTER — Ambulatory Visit (INDEPENDENT_AMBULATORY_CARE_PROVIDER_SITE_OTHER): Payer: BC Managed Care – PPO | Admitting: Orthopedic Surgery

## 2022-12-16 DIAGNOSIS — M19011 Primary osteoarthritis, right shoulder: Secondary | ICD-10-CM | POA: Diagnosis not present

## 2022-12-17 ENCOUNTER — Encounter: Payer: Self-pay | Admitting: Orthopedic Surgery

## 2022-12-17 MED ORDER — LIDOCAINE HCL 1 % IJ SOLN
3.0000 mL | INTRAMUSCULAR | Status: AC | PRN
  Administered 2022-12-16: 3 mL

## 2022-12-17 NOTE — Progress Notes (Addendum)
Office Visit Note   Patient: Christy Cooper           Date of Birth: 26-Sep-1976           MRN: [id number] Visit Date: 12/16/2022 Requested by: Kathyrn Lass, [place],  [place] [id number] PCP: Kathyrn Lass, MD  Subjective: Chief Complaint  Patient presents with   Right Shoulder - Pain    HPI: Christy Cooper is a 47 y.o. female who presents to the office reporting right shoulder pain.  She has had 2 prior injections which were subacromial injections.  There is old notes were reviewed.  Hard for her to sleep on the right-hand side.  Did physical therapy for 4 to 5 weeks.  Now she has pain superior and anterior in the shoulder all the time.  Overhead motion is worse.  She likes to play pickle ball.  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  The pain will occasionally radiate to the biceps and elbow region.  Does not do much repetitive activity.  Tylenol helps.  She has had an MRI scan which shows intact biceps tendon significant edema in the distal end of the clavicle.  Rotator cuff and labrum intact..                ROS: All systems reviewed are negative as they relate to the chief complaint within the history of present illness.  Patient denies fevers or chills.  Assessment & Plan: Visit Diagnoses:  1. Arthritis of right acromioclavicular joint     Plan: Impression is right shoulder pain with significant AC joint arthritis and edema on radiographs MRI scan and by clinical exam.  Rotator cuff intact.  Discussed a lot with Margo about operative and nonoperative interventions.  The intensity of the edema in the [facility] joint makes it a significant pain generator.  Plan at this time is posting for arthroscopy subacromial decompression because she did get some relief from the 2 subacromial injections as well as distal clavicle excision arthroscopically.  The risk and benefits of the procedure discussed with the patient include not limited to  infection or vessel damage shoulder stiffness incomplete pain relief as well as the rehabilitation time required after surgery.  All questions answered.  We did do a diagnostic injection into the [facility] joint with lidocaine today under ultrasound guidance to confirm the pain generation from the [facility] joint.  Follow-up 1 week after surgery.  Follow-Up Instructions: No follow-ups on file.   Orders:  No orders of the defined types were placed in this encounter.  No orders of the defined types were placed in this encounter.     Procedures: Medium Joint Inj: R acromioclavicular on 12/16/2022 6:56 PM Indications: pain and diagnostic evaluation Details: 25 G 1.5 in needle, superior approach Medications: 3 mL lidocaine 1 % Outcome: tolerated well, no immediate complications Procedure, treatment alternatives, risks and benefits explained, specific risks discussed. Consent was given by the patient. Immediately prior to procedure a time out was called to verify the correct patient, procedure, equipment, support staff and site/side marked as required. Patient was prepped and draped in the usual sterile fashion.       Clinical Data: No additional findings.  Objective: Vital Signs: There were no vitals taken for this visit.  Physical Exam:  Constitutional: Patient appears well-developed HEENT:  Head: Normocephalic Eyes:EOM are normal Neck: Normal range of motion Cardiovascular: Normal rate Pulmonary/chest: Effort  normal Neurologic: Patient is alert Skin: Skin is warm Psychiatric: Patient has normal mood and affect  Ortho Exam: Ortho exam demonstrates good cervical spine range of motion.  5 out of 5 grip EPL FPL interosseous resection extension bicep triceps and deltoid strength.  She does have tenderness of the [facility] joint on the right compared to the left.  No masses lymphadenopathy or skin changes noted in the shoulder region.  Range of motion is 65/100/175.  She does have some popping in the [facility]  joint with crossarm adduction.  O'Brien's testing is negative.  Speeds testing positive.  Specialty Comments:  No specialty comments available.  Imaging: No results found.   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

## 2022-12-17 NOTE — Addendum Note (Signed)
Addended by: Marcene Duos on: 12/17/2022 06:58 PM   Modules accepted: Level of Service

## 2022-12-20 ENCOUNTER — Encounter: Payer: Self-pay | Admitting: Nurse Practitioner

## 2022-12-20 ENCOUNTER — Encounter: Payer: Self-pay | Admitting: Orthopedic Surgery

## 2022-12-21 ENCOUNTER — Telehealth: Payer: Self-pay

## 2022-12-21 NOTE — Telephone Encounter (Signed)
PA submitted through Cover My Meds for Zepbound. Awaiting insurance determination. Key: [id number]

## 2022-12-23 ENCOUNTER — Other Ambulatory Visit: Payer: Self-pay | Admitting: Surgical

## 2022-12-23 ENCOUNTER — Encounter: Payer: Self-pay | Admitting: Orthopedic Surgery

## 2022-12-23 DIAGNOSIS — G8918 Other acute postprocedural pain: Secondary | ICD-10-CM | POA: Diagnosis not present

## 2022-12-23 DIAGNOSIS — M7551 Bursitis of right shoulder: Secondary | ICD-10-CM | POA: Diagnosis not present

## 2022-12-23 DIAGNOSIS — M19011 Primary osteoarthritis, right shoulder: Secondary | ICD-10-CM | POA: Diagnosis not present

## 2022-12-23 DIAGNOSIS — M24111 Other articular cartilage disorders, right shoulder: Secondary | ICD-10-CM | POA: Diagnosis not present

## 2022-12-23 MED ORDER — METHOCARBAMOL 500 MG PO TABS: 500.0000 mg | ORAL_TABLET | Freq: Three times a day (TID) | ORAL | 1 refills | Status: AC | PRN

## 2022-12-23 MED ORDER — OXYCODONE HCL 5 MG PO TABS: 5.0000 mg | ORAL_TABLET | ORAL | 0 refills | Status: DC | PRN

## 2022-12-27 ENCOUNTER — Other Ambulatory Visit: Payer: Self-pay

## 2022-12-27 ENCOUNTER — Telehealth: Payer: Self-pay

## 2022-12-27 ENCOUNTER — Other Ambulatory Visit: Payer: Self-pay | Admitting: Surgical

## 2022-12-27 MED ORDER — TRAMADOL HCL 50 MG PO TABS: 100.0000 mg | ORAL_TABLET | Freq: Four times a day (QID) | ORAL | 0 refills | Status: AC | PRN

## 2022-12-27 NOTE — Progress Notes (Signed)
Dr Marlou Sa request for patient to be changed to ultram. Can you please submit?

## 2022-12-27 NOTE — Progress Notes (Signed)
I sent in

## 2022-12-27 NOTE — Telephone Encounter (Signed)
Probably drug rash I called lmom

## 2022-12-27 NOTE — Telephone Encounter (Signed)
Pt left vm on triage line. Pt is s/p a shoulder scope and distal clavicle resection on 12/23/2022 she states that she has a rash now under both arms and does not know what she should do On the message she sounded very tearful and asked for a call back to discuss. [phone]

## 2022-12-27 NOTE — Telephone Encounter (Signed)
IC patient LMVM advising waiting response from Dr Dean/Luke and that as soon as I knew something would reach out to her.

## 2022-12-27 NOTE — Telephone Encounter (Signed)
Tried twice today to call but always got her answering machine.  Also sent message through [facility] about the rash in terms of trying Benadryl first and if that does not help could consider Medrol Dosepak as this looks like it is a drug reaction.  Could also consider holding off on the oxycodone and trying tramadol if the Benadryl does not help.

## 2022-12-29 NOTE — Telephone Encounter (Signed)
Received notification through Cover My Meds that Zepbound has been approved.  This request has received a Favorable outcome from [place].

## 2022-12-30 ENCOUNTER — Ambulatory Visit (INDEPENDENT_AMBULATORY_CARE_PROVIDER_SITE_OTHER): Payer: BC Managed Care – PPO | Admitting: Surgical

## 2022-12-30 DIAGNOSIS — M19011 Primary osteoarthritis, right shoulder: Secondary | ICD-10-CM

## 2022-12-31 ENCOUNTER — Encounter: Payer: Self-pay | Admitting: Surgical

## 2022-12-31 NOTE — Progress Notes (Signed)
   Post-Op Visit Note   Patient: Christy Cooper           Date of Birth: [birth year]           MRN: [id number] Visit Date: 12/30/2022 PCP: Kathyrn Lass, MD   Assessment & Plan:  Chief Complaint:  Chief Complaint  Patient presents with   Right Shoulder - Follow-up   Visit Diagnoses: No diagnosis found.  Plan: Patient is a 47 y.o. female who presents s/p right shoulder arthroscopy with distal clavicle excision on 12/23/22.  Initially her shoulder was quite sore after surgery but now pain is feeling improved.  Taking pain medication ~2x per day.  No fevers, chills, chest pain, shortness of breath.  Using sling. Sleeping okay in her regular bed with the assistance of a pillow    On exam, patient has 60 degrees external rotation, 75 degrees abduction, and 125 degrees forward elevation passively.  Incisions look to be healing well with sutures intact. No sign of infection or dehiscence. Intact EPL, FPL, finger abduction, grip strength, wrist extension, pro/sup, bicep, tricep. Intact axillary nerve with deltoid firing.    Plan is to discontinue sling.  No heavy lifting with operative arm but okay to use arm for ADLs and work on ROM exercises which were provided to the patient today.  Follow-up with Dr Marlou Sa in clinic in 4 weeks. Call with any concerns in the meantime.    Follow-Up Instructions: Return in about 4 weeks (around 01/27/2023).   Orders:  No orders of the defined types were placed in this encounter.  No orders of the defined types were placed in this encounter.   Imaging: No results found.  PMFS History: Patient Active Problem List   Diagnosis Date Noted   SOBOE (shortness of breath on exertion) 10/24/2022   Low vitamin B12 level 10/24/2022   Mixed hyperlipidemia 10/24/2022   Abnormal craving 09/26/2022   Vitamin D deficiency 09/26/2022   Class 1 obesity with serious comorbidity and body mass index (BMI) of 30.0 to 30.9 in adult 09/26/2022   Mood disorder ([place])- emotional  eating 04/19/2022   Past Medical History:  Diagnosis Date   Perimenopause     Family History  Problem Relation Age of Onset   Hypertension Father    Diabetes Father    Kidney disease Father     Past Surgical History:  Procedure Laterality Date   [place]  2007   herniated disc   TONSILLECTOMY  1983   Social History   Occupational History   Occupation: Window and Freight forwarder  Tobacco Use   Smoking status: Former   Smokeless tobacco: Never  Substance and Sexual Activity   Alcohol use: Not on file   Drug use: Not on file   Sexual activity: Not on file

## 2023-01-05 ENCOUNTER — Ambulatory Visit: Payer: BC Managed Care – PPO | Admitting: Nurse Practitioner

## 2023-01-20 ENCOUNTER — Ambulatory Visit
Admission: RE | Admit: 2023-01-20 | Discharge: 2023-01-20 | Disposition: A | Discharge status: Home / Self Care | Payer: BC Managed Care – PPO | Source: Ambulatory Visit | Attending: Family Medicine | Admitting: Family Medicine

## 2023-01-20 DIAGNOSIS — Z1231 Encounter for screening mammogram for malignant neoplasm of breast: Secondary | ICD-10-CM | POA: Diagnosis not present

## 2023-01-20 DIAGNOSIS — F900 Attention-deficit hyperactivity disorder, predominantly inattentive type: Secondary | ICD-10-CM | POA: Diagnosis not present

## 2023-01-20 DIAGNOSIS — F502 Bulimia nervosa: Secondary | ICD-10-CM | POA: Diagnosis not present

## 2023-01-20 DIAGNOSIS — F411 Generalized anxiety disorder: Secondary | ICD-10-CM | POA: Diagnosis not present

## 2023-01-23 ENCOUNTER — Encounter: Payer: Self-pay | Admitting: Orthopedic Surgery

## 2023-01-23 ENCOUNTER — Ambulatory Visit: Payer: BC Managed Care – PPO | Admitting: Nurse Practitioner

## 2023-01-23 ENCOUNTER — Ambulatory Visit (INDEPENDENT_AMBULATORY_CARE_PROVIDER_SITE_OTHER): Payer: BC Managed Care – PPO | Admitting: Orthopedic Surgery

## 2023-01-23 ENCOUNTER — Encounter: Payer: Self-pay | Admitting: Nurse Practitioner

## 2023-01-23 VITALS — BP 128/79 | HR 72 | Temp 98.2°F | Ht 67.0 in | Wt 191.0 lb

## 2023-01-23 DIAGNOSIS — Z6829 Body mass index (BMI) 29.0-29.9, adult: Secondary | ICD-10-CM | POA: Diagnosis not present

## 2023-01-23 DIAGNOSIS — M19011 Primary osteoarthritis, right shoulder: Secondary | ICD-10-CM

## 2023-01-23 DIAGNOSIS — E669 Obesity, unspecified: Secondary | ICD-10-CM | POA: Diagnosis not present

## 2023-01-23 DIAGNOSIS — E559 Vitamin D deficiency, unspecified: Secondary | ICD-10-CM | POA: Diagnosis not present

## 2023-01-23 MED ORDER — ZEPBOUND 5 MG/0.5ML ~~LOC~~ SOAJ: 5.0000 mg | SUBCUTANEOUS | 0 refills | Status: AC

## 2023-01-23 MED ORDER — VITAMIN D (ERGOCALCIFEROL) 1.25 MG (50000 UNIT) PO CAPS: 50000.0000 [IU] | ORAL_CAPSULE | ORAL | 0 refills | Status: AC

## 2023-01-23 NOTE — Progress Notes (Signed)
Office: [phone]  /  Fax: [phone]  WEIGHT SUMMARY AND BIOMETRICS  Weight Lost Since Last Visit: 1lb  No data recorded  Vitals Temp: 98.2 F (36.8 C) BP: 128/79 Pulse Rate: 72 SpO2: 99 %   Anthropometric Measurements Height: '5\' 7"'$  (1.702 m) Weight: 191 lb (86.6 kg) BMI (Calculated): 29.91 Weight at Last Visit: 192lb Weight Lost Since Last Visit: 1lb Starting Weight: 195lb Total Weight Loss (lbs): 4 lb (1.814 kg)   Body Composition  Body Fat %: 39.2 % Fat Mass (lbs): 75.2 lbs Muscle Mass (lbs): 110.6 lbs Total Body Water (lbs): 76.6 lbs Visceral Fat Rating : 8   Other Clinical Data Today's Visit #: 11 Starting Date: 04/19/22     HPI  Chief Complaint: OBESITY  Christy Cooper is here to discuss her progress with her obesity treatment plan. She is on the the Category 2 Plan and states she is following her eating plan approximately 35 % of the time. She states she is exercising 0 minutes 0 days per week.   Interval History:  Since last office visit she has lost 1 pound.  She had surgery on her shoulder Feb 9th.  She has her 4 week post op appt today.  She hasn't been able to exercise since having surgery.  Goal weight 170 lbs.  She is going to the beach prior to her next visit.    Pharmacotherapy for weight loss: She is currently taking Zepbound for medical weight loss.  Denies side effects.  Has been helping with cravings and appetite however feels it starts wearing off 3-4 days prior to next dose.    Previous pharmacotherapy for medical weight loss:    She has tried Topamax in the past for cravings.    Bariatric surgery:  never had bariatric surgery.   Vit D deficiency  She is taking Vit D 50,000 IU weekly.  Denies side effects.  Denies nausea, vomiting or muscle weakness.    Lab Results  Component Value Date   VD25OH 38.0 10/24/2022   VD25OH 32.1 04/19/2022        PHYSICAL EXAM:  Blood pressure 128/79, pulse 72, temperature 98.2 F (36.8 C),  height '5\' 7"'$  (1.702 m), weight 191 lb (86.6 kg), SpO2 99 %. Body mass index is 29.91 kg/m.  General: She is overweight, cooperative, alert, well developed, and in no acute distress. PSYCH: Has normal mood, affect and thought process.   Extremities: No edema.  Neurologic: No gross sensory or motor deficits. No tremors or fasciculations noted.    DIAGNOSTIC DATA REVIEWED:  BMET    Component Value Date/Time   NA 139 10/24/2022 1007   K 5.1 10/24/2022 1007   CL 103 10/24/2022 1007   CO2 22 10/24/2022 1007   GLUCOSE 91 10/24/2022 1007   BUN 14 10/24/2022 1007   CREATININE 0.74 10/24/2022 1007   CALCIUM 9.4 10/24/2022 1007   Lab Results  Component Value Date   HGBA1C 5.1 04/19/2022   Lab Results  Component Value Date   INSULIN 4.7 04/19/2022   Lab Results  Component Value Date   TSH 1.750 04/19/2022   CBC    Component Value Date/Time   WBC 5.4 04/19/2022 1137   WBC 8.3 02/17/2011 0505   RBC 4.35 04/19/2022 1137   RBC 3.53 (L) 02/17/2011 0505   HGB 13.6 04/19/2022 1137   HCT 39.7 04/19/2022 1137   PLT 217 04/19/2022 1137   MCV 91 04/19/2022 1137   MCH 31.3 04/19/2022 1137   MCH 28.0  02/17/2011 0505   MCHC 34.3 04/19/2022 1137   MCHC 32.1 02/17/2011 0505   RDW 12.9 04/19/2022 1137   Iron Studies No results found for: "IRON", "TIBC", "FERRITIN", "IRONPCTSAT" Lipid Panel     Component Value Date/Time   CHOL 204 (H) 10/24/2022 1007   TRIG 61 10/24/2022 1007   HDL 71 10/24/2022 1007   LDLCALC 122 (H) 10/24/2022 1007   Hepatic Function Panel     Component Value Date/Time   PROT 6.7 10/24/2022 1007   ALBUMIN 4.1 10/24/2022 1007   AST 25 10/24/2022 1007   ALT 22 10/24/2022 1007   ALKPHOS 69 10/24/2022 1007   BILITOT 0.4 10/24/2022 1007      Component Value Date/Time   TSH 1.750 04/19/2022 1137   Nutritional Lab Results  Component Value Date   VD25OH 38.0 10/24/2022   VD25OH 32.1 04/19/2022     ASSESSMENT AND PLAN  TREATMENT PLAN FOR  OBESITY:  Recommended Dietary Goals  Christy Cooper is currently in the action stage of change. As such, her goal is to continue weight management plan. She has agreed to the Category 2 Plan.  Behavioral Intervention  We discussed the following Behavioral Modification Strategies today: increasing lean protein intake, decreasing simple carbohydrates , increasing vegetables, avoiding skipping meals, increasing water intake, and work on meal planning and easy cooking plans.  Additional resources provided today: NA  Recommended Physical Activity Goals  Christy Cooper has been advised to work up to 150 minutes of moderate intensity aerobic activity a week and strengthening exercises 2-3 times per week for cardiovascular health, weight loss maintenance and preservation of muscle mass.   She has agreed to continue physical activity as is.    Pharmacotherapy We discussed various medication options to help Christy Cooper with her weight loss efforts and we both agreed to increase Zepbound to '5mg'$ .  ASSOCIATED CONDITIONS ADDRESSED TODAY  Action/Plan  Vitamin D deficiency -     Vitamin D (Ergocalciferol); Take 1 capsule (50,000 Units total) by mouth every 7 (seven) days.  Dispense: 4 capsule; Refill: 0  Low Vitamin D level contributes to fatigue and are associated with obesity, breast, and colon cancer. She agrees to continue to take prescription Vitamin D '@50'$ ,000 IU every week and will follow-up for routine testing of Vitamin D, at least 2-3 times per year to avoid over-replacement.   Generalized obesity -     Zepbound; Inject 5 mg into the skin once a week.  Dispense: 2 mL; Refill: 0  BMI 29.0-29.9,adult         Return in about 4 weeks (around 02/20/2023).Marland Kitchen She was informed of the importance of frequent follow up visits to maximize her success with intensive lifestyle modifications for her multiple health conditions.   ATTESTASTION STATEMENTS:  Reviewed by clinician on day of visit: allergies,  medications, problem list, medical history, surgical history, family history, social history, and previous encounter notes.     Ailene Rud. Lister Brizzi FNP-C

## 2023-01-23 NOTE — Progress Notes (Signed)
   Post-Op Visit Note   Patient: Christy Cooper           Date of Birth: 1976/10/19           MRN: [id number] Visit Date: 01/23/2023 PCP: Kathyrn Lass, MD   Assessment & Plan:  Chief Complaint:  Chief Complaint  Patient presents with   Right Shoulder - Follow-up    right shoulder arthroscopy with distal clavicle excision on 12/23/22   Visit Diagnoses:  1. Arthritis of right acromioclavicular joint     Plan: Christy Cooper is a patient who is now about 4 weeks out right shoulder arthroscopy with arthroscopic distal clavicle excision.  Overall she is about 70% better.  Does report some occasional aching in the lateral aspect of her arm from time to time.  She played pickle ball recently 6 games and had a little bit of pain after 4 games.  So hard for her to sleep on the right-hand side.  She does work out with a Clinical research associate.  She does machine and free weights.  On examination she has excellent range of motion and minimal tenderness around the [facility] joint.  No crepitus is present.  Rotator cuff strength is very good.  Plan at this time is progressive increase in activity but no real working out with resistance and weights until 2 weeks.  Come back in 8 weeks for final recheck.  Follow-Up Instructions: No follow-ups on file.   Orders:  No orders of the defined types were placed in this encounter.  No orders of the defined types were placed in this encounter.   Imaging: No results found.  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

## 2023-02-01 ENCOUNTER — Encounter: Payer: Self-pay | Admitting: Nurse Practitioner

## 2023-02-13 ENCOUNTER — Encounter: Payer: Self-pay | Admitting: Orthopedic Surgery

## 2023-02-14 NOTE — Telephone Encounter (Signed)
Next opening okay

## 2023-02-14 NOTE — Telephone Encounter (Signed)
Ok to wait until next opening or work in? Next opening is next Friday 4/12

## 2023-02-14 NOTE — Telephone Encounter (Signed)
Normal to have some pain but seems like it is worse than at her last appointment with Dr Marlou Sa.  I think she should come in sooner to have it checked out

## 2023-02-24 ENCOUNTER — Other Ambulatory Visit: Payer: Self-pay

## 2023-02-24 ENCOUNTER — Ambulatory Visit (INDEPENDENT_AMBULATORY_CARE_PROVIDER_SITE_OTHER): Payer: BC Managed Care – PPO | Admitting: Surgical

## 2023-02-24 DIAGNOSIS — M25511 Pain in right shoulder: Secondary | ICD-10-CM

## 2023-02-24 DIAGNOSIS — M19011 Primary osteoarthritis, right shoulder: Secondary | ICD-10-CM

## 2023-02-24 DIAGNOSIS — M25611 Stiffness of right shoulder, not elsewhere classified: Secondary | ICD-10-CM

## 2023-02-26 ENCOUNTER — Encounter: Payer: Self-pay | Admitting: Surgical

## 2023-02-26 MED ORDER — LIDOCAINE HCL 1 % IJ SOLN
5.0000 mL | INTRAMUSCULAR | Status: AC | PRN
  Administered 2023-02-24: 5 mL

## 2023-02-26 MED ORDER — BUPIVACAINE HCL 0.5 % IJ SOLN
9.0000 mL | INTRAMUSCULAR | Status: AC | PRN
  Administered 2023-02-24: 9 mL via INTRA_ARTICULAR

## 2023-02-26 NOTE — Progress Notes (Signed)
Post-Op Visit Note   Patient: Christy Cooper           Date of Birth: Feb 19, 1976           MRN: [id number] Visit Date: 02/24/2023 PCP: Sigmund Hazel, MD   Assessment & Plan:  Chief Complaint:  Chief Complaint  Patient presents with   Other    right shoulder arthroscopy with distal clavicle excision on 12/23/22    Visit Diagnoses:  1. Right shoulder pain, unspecified chronicity     Plan: Patient is a 47 year old female who presents s/p right shoulder arthroscopy with distal clavicle excision on 12/23/2022.  She is about 2 months out.  She initially was doing well following her procedure aside from typical postop pain.  She then saw Dr. August Saucer on 01/23/2023 and she noted 70% improvement of her symptoms with Dr. August Saucer noting excellent range of motion of the right shoulder at that point.  However, over the last several weeks she has noticed increased difficulty going overhead and difficulty with sleeping at night.  She has not been doing any home exercise program or formal physical therapy, instead trying to take it very easy with the shoulder in order to keep the shoulder from flaring up.  She denies any neck pain, radicular pain, scapular pain, numbness/tingling around the right shoulder.  She does describe primarily lateral pain with some radiation down to the elbow at times which is different from her pain prior to her surgery which is mostly superior by her history.  She denies any history of diabetes or thyroid disease and no major medical problems overall.  On exam, patient has 45 degrees external rotation, 75 degrees abduction, 140 degrees forward elevation passively of the right shoulder.  This compared with the left shoulder with 70 degrees external rotation, 120 degrees abduction, 180 degrees forward elevation passively and actively.  She has excellent rotator cuff strength of supra, infra, subscap.  Axillary nerve is intact with deltoid firing.  She has no tenderness over the acromion and  minimal tenderness over the [facility] joint.  She does have mild to moderate tenderness over the bicipital groove.  She has most pain with passive motion of the shoulder, particularly with external rotation.  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  She has decreased range of motion today in regards to her external rotation compared with her first postop visit from 12/30/2022.  Will plan to have her do home exercise program that was provided for her today and she will also get in with physical therapy to work on just passive and active range of motion of the shoulder.  We administered a ultrasound-guided glenohumeral injection consisting of 9 cc of bupivacaine mixed with 1 cc of Toradol to provide some anti-inflammatory and pain relief benefit.  Too soon after surgery to try cortisone injection but if she has continued symptoms at her next appointment with Dr. August Saucer in 4 weeks, could consider cortisone injection at that point.  Patient agreed with this plan.  Follow-up in 4 weeks.   Procedure Note  Patient: Christy Cooper             Date of Birth: [birth year]           MRN: [id number]             Visit Date: 02/24/2023  Procedures: Visit Diagnoses:  1. Right shoulder pain, unspecified chronicity     Large Joint Inj: R glenohumeral on 02/24/2023 1:44 PM Indications: diagnostic evaluation and pain Details:  22 G 1.5 in and 3.5 in needle, ultrasound-guided posterior approach  Arthrogram: No  Medications: 9 mL bupivacaine 0.5 %; 5 mL lidocaine 1 % (9 cc of bupivacaine mixed with 1 cc of Toradol) Outcome: tolerated well, no immediate complications Procedure, treatment alternatives, risks and benefits explained, specific risks discussed. Consent was given by the patient. Immediately prior to procedure a time out was called to verify the correct patient, procedure, equipment, support staff and site/side marked as required. Patient was prepped and draped in the usual sterile fashion.          Follow-Up Instructions: Return in about 4 weeks (around 03/24/2023).   Orders:  Orders Placed This Encounter  Procedures   US Guided Needle Placement - No Linked Charges   No orders of the defined types were placed in this encounter.   Imaging: No results found.  PMFS History: Patient Active Problem List   Diagnosis Date Noted   SOBOE (shortness of breath on exertion) 10/24/2022   Low vitamin B12 level 10/24/2022   Mixed hyperlipidemia 10/24/2022   Abnormal craving 09/26/2022   Vitamin D deficiency 09/26/2022   Generalized obesity 09/26/2022   Mood disorder ([facility])- emotional eating 04/19/2022   Past Medical History:  Diagnosis Date   Perimenopause     Family History  Problem Relation Age of Onset   Hypertension Father    Diabetes Father    Kidney disease Father     Past Surgical History:  Procedure Laterality Date   CLAVICLE SURGERY  1990   NECK SURGERY  2007   herniated disc   TONSILLECTOMY  1983   Social History   Occupational History   Occupation: Window and IT consultant  Tobacco Use   Smoking status: Former   Smokeless tobacco: Never  Substance and Sexual Activity   Alcohol use: Not on file   Drug use: Not on file   Sexual activity: Not on file

## 2023-02-27 ENCOUNTER — Ambulatory Visit: Payer: BC Managed Care – PPO | Admitting: Nurse Practitioner

## 2023-03-01 DIAGNOSIS — R61 Generalized hyperhidrosis: Secondary | ICD-10-CM | POA: Diagnosis not present

## 2023-03-01 DIAGNOSIS — Z309 Encounter for contraceptive management, unspecified: Secondary | ICD-10-CM | POA: Diagnosis not present

## 2023-03-01 DIAGNOSIS — Z87891 Personal history of nicotine dependence: Secondary | ICD-10-CM | POA: Diagnosis not present

## 2023-03-01 DIAGNOSIS — Z0000 Encounter for general adult medical examination without abnormal findings: Secondary | ICD-10-CM | POA: Diagnosis not present

## 2023-03-01 DIAGNOSIS — F909 Attention-deficit hyperactivity disorder, unspecified type: Secondary | ICD-10-CM | POA: Diagnosis not present

## 2023-03-01 DIAGNOSIS — E559 Vitamin D deficiency, unspecified: Secondary | ICD-10-CM | POA: Diagnosis not present

## 2023-03-07 DIAGNOSIS — R531 Weakness: Secondary | ICD-10-CM | POA: Diagnosis not present

## 2023-03-07 DIAGNOSIS — M25611 Stiffness of right shoulder, not elsewhere classified: Secondary | ICD-10-CM | POA: Diagnosis not present

## 2023-03-07 DIAGNOSIS — M25511 Pain in right shoulder: Secondary | ICD-10-CM | POA: Diagnosis not present

## 2023-03-08 DIAGNOSIS — F900 Attention-deficit hyperactivity disorder, predominantly inattentive type: Secondary | ICD-10-CM | POA: Diagnosis not present

## 2023-03-08 DIAGNOSIS — F502 Bulimia nervosa: Secondary | ICD-10-CM | POA: Diagnosis not present

## 2023-03-08 DIAGNOSIS — F411 Generalized anxiety disorder: Secondary | ICD-10-CM | POA: Diagnosis not present

## 2023-03-09 DIAGNOSIS — M25611 Stiffness of right shoulder, not elsewhere classified: Secondary | ICD-10-CM | POA: Diagnosis not present

## 2023-03-09 DIAGNOSIS — M25511 Pain in right shoulder: Secondary | ICD-10-CM | POA: Diagnosis not present

## 2023-03-09 DIAGNOSIS — R531 Weakness: Secondary | ICD-10-CM | POA: Diagnosis not present

## 2023-03-13 DIAGNOSIS — R531 Weakness: Secondary | ICD-10-CM | POA: Diagnosis not present

## 2023-03-13 DIAGNOSIS — M25511 Pain in right shoulder: Secondary | ICD-10-CM | POA: Diagnosis not present

## 2023-03-13 DIAGNOSIS — M25611 Stiffness of right shoulder, not elsewhere classified: Secondary | ICD-10-CM | POA: Diagnosis not present

## 2023-03-16 DIAGNOSIS — R531 Weakness: Secondary | ICD-10-CM | POA: Diagnosis not present

## 2023-03-16 DIAGNOSIS — M25511 Pain in right shoulder: Secondary | ICD-10-CM | POA: Diagnosis not present

## 2023-03-16 DIAGNOSIS — M25611 Stiffness of right shoulder, not elsewhere classified: Secondary | ICD-10-CM | POA: Diagnosis not present

## 2023-03-20 DIAGNOSIS — R531 Weakness: Secondary | ICD-10-CM | POA: Diagnosis not present

## 2023-03-20 DIAGNOSIS — M25511 Pain in right shoulder: Secondary | ICD-10-CM | POA: Diagnosis not present

## 2023-03-20 DIAGNOSIS — M25611 Stiffness of right shoulder, not elsewhere classified: Secondary | ICD-10-CM | POA: Diagnosis not present

## 2023-03-22 ENCOUNTER — Encounter: Payer: Self-pay | Admitting: Orthopedic Surgery

## 2023-03-22 ENCOUNTER — Ambulatory Visit (INDEPENDENT_AMBULATORY_CARE_PROVIDER_SITE_OTHER): Payer: BC Managed Care – PPO | Admitting: Orthopedic Surgery

## 2023-03-22 DIAGNOSIS — M25611 Stiffness of right shoulder, not elsewhere classified: Secondary | ICD-10-CM

## 2023-03-22 DIAGNOSIS — M25511 Pain in right shoulder: Secondary | ICD-10-CM

## 2023-03-22 MED ORDER — MELOXICAM 15 MG PO TABS: 15.0000 mg | ORAL_TABLET | Freq: Every day | ORAL | 0 refills | Status: DC

## 2023-03-22 NOTE — Progress Notes (Signed)
   Post-Op Visit Note   Patient: Christy Cooper           Date of Birth: 04-07-1976           MRN: [id number] Visit Date: 03/22/2023 PCP: Sigmund Hazel, MD   Assessment & Plan:  Chief Complaint:  Chief Complaint  Patient presents with   Right Shoulder - Pain, Follow-up   Visit Diagnoses: No diagnosis found.  Plan: Patient presents for follow-up of her right shoulder.  She underwent distal clavicle excision about 3 months ago.  Did develop some shoulder stiffness.  Physical therapy is helping.  Last injection performed at the last clinic visit was not particularly helpful but somewhat painful.  On examination she does not have much tenderness around the [facility] joint but she does have a stiff shoulder with range of motion of 25/70/110.  Plan is to continue physical therapy and Mobic for 4 weeks.  6-week return with consideration of intra-articular cortisone injection at that time  Follow-Up Instructions: No follow-ups on file.   Orders:  No orders of the defined types were placed in this encounter.  Meds ordered this encounter  Medications   meloxicam (MOBIC) 15 MG tablet    Sig: Take 1 tablet (15 mg total) by mouth daily.    Dispense:  30 tablet    Refill:  0    Imaging: No results found.  PMFS History: Patient Active Problem List   Diagnosis Date Noted   SOBOE (shortness of breath on exertion) 10/24/2022   Low vitamin B12 level 10/24/2022   Mixed hyperlipidemia 10/24/2022   Abnormal craving 09/26/2022   Vitamin D deficiency 09/26/2022   Generalized obesity 09/26/2022   Mood disorder (HCC)- emotional eating 04/19/2022   Past Medical History:  Diagnosis Date   Perimenopause     Family History  Problem Relation Age of Onset   Hypertension Father    Diabetes Father    Kidney disease Father     Past Surgical History:  Procedure Laterality Date   CLAVICLE SURGERY  1990   NECK SURGERY  2007   herniated disc   TONSILLECTOMY  1983   Social History   Occupational  History   Occupation: Window and IT consultant  Tobacco Use   Smoking status: Former   Smokeless tobacco: Never  Substance and Sexual Activity   Alcohol use: Not on file   Drug use: Not on file   Sexual activity: Not on file

## 2023-03-27 DIAGNOSIS — M25611 Stiffness of right shoulder, not elsewhere classified: Secondary | ICD-10-CM | POA: Diagnosis not present

## 2023-03-27 DIAGNOSIS — R531 Weakness: Secondary | ICD-10-CM | POA: Diagnosis not present

## 2023-03-27 DIAGNOSIS — M25511 Pain in right shoulder: Secondary | ICD-10-CM | POA: Diagnosis not present

## 2023-03-28 DIAGNOSIS — F411 Generalized anxiety disorder: Secondary | ICD-10-CM | POA: Diagnosis not present

## 2023-03-28 DIAGNOSIS — F900 Attention-deficit hyperactivity disorder, predominantly inattentive type: Secondary | ICD-10-CM | POA: Diagnosis not present

## 2023-03-28 DIAGNOSIS — F502 Bulimia nervosa: Secondary | ICD-10-CM | POA: Diagnosis not present

## 2023-03-29 DIAGNOSIS — R531 Weakness: Secondary | ICD-10-CM | POA: Diagnosis not present

## 2023-03-29 DIAGNOSIS — M25511 Pain in right shoulder: Secondary | ICD-10-CM | POA: Diagnosis not present

## 2023-03-29 DIAGNOSIS — M25611 Stiffness of right shoulder, not elsewhere classified: Secondary | ICD-10-CM | POA: Diagnosis not present

## 2023-04-03 DIAGNOSIS — M25511 Pain in right shoulder: Secondary | ICD-10-CM | POA: Diagnosis not present

## 2023-04-03 DIAGNOSIS — M25611 Stiffness of right shoulder, not elsewhere classified: Secondary | ICD-10-CM | POA: Diagnosis not present

## 2023-04-03 DIAGNOSIS — R531 Weakness: Secondary | ICD-10-CM | POA: Diagnosis not present

## 2023-04-05 DIAGNOSIS — M25611 Stiffness of right shoulder, not elsewhere classified: Secondary | ICD-10-CM | POA: Diagnosis not present

## 2023-04-05 DIAGNOSIS — R531 Weakness: Secondary | ICD-10-CM | POA: Diagnosis not present

## 2023-04-05 DIAGNOSIS — M25511 Pain in right shoulder: Secondary | ICD-10-CM | POA: Diagnosis not present

## 2023-04-11 DIAGNOSIS — M25511 Pain in right shoulder: Secondary | ICD-10-CM | POA: Diagnosis not present

## 2023-04-11 DIAGNOSIS — M25611 Stiffness of right shoulder, not elsewhere classified: Secondary | ICD-10-CM | POA: Diagnosis not present

## 2023-04-11 DIAGNOSIS — R531 Weakness: Secondary | ICD-10-CM | POA: Diagnosis not present

## 2023-04-13 DIAGNOSIS — M25611 Stiffness of right shoulder, not elsewhere classified: Secondary | ICD-10-CM | POA: Diagnosis not present

## 2023-04-13 DIAGNOSIS — R531 Weakness: Secondary | ICD-10-CM | POA: Diagnosis not present

## 2023-04-13 DIAGNOSIS — M25511 Pain in right shoulder: Secondary | ICD-10-CM | POA: Diagnosis not present

## 2023-04-17 DIAGNOSIS — R531 Weakness: Secondary | ICD-10-CM | POA: Diagnosis not present

## 2023-04-17 DIAGNOSIS — M25611 Stiffness of right shoulder, not elsewhere classified: Secondary | ICD-10-CM | POA: Diagnosis not present

## 2023-04-17 DIAGNOSIS — M25511 Pain in right shoulder: Secondary | ICD-10-CM | POA: Diagnosis not present

## 2023-04-19 DIAGNOSIS — M25511 Pain in right shoulder: Secondary | ICD-10-CM | POA: Diagnosis not present

## 2023-04-19 DIAGNOSIS — R531 Weakness: Secondary | ICD-10-CM | POA: Diagnosis not present

## 2023-04-19 DIAGNOSIS — M25611 Stiffness of right shoulder, not elsewhere classified: Secondary | ICD-10-CM | POA: Diagnosis not present

## 2023-04-22 ENCOUNTER — Other Ambulatory Visit: Payer: Self-pay | Admitting: Orthopedic Surgery

## 2023-04-24 DIAGNOSIS — M25511 Pain in right shoulder: Secondary | ICD-10-CM | POA: Diagnosis not present

## 2023-04-24 DIAGNOSIS — R531 Weakness: Secondary | ICD-10-CM | POA: Diagnosis not present

## 2023-04-24 DIAGNOSIS — M25611 Stiffness of right shoulder, not elsewhere classified: Secondary | ICD-10-CM | POA: Diagnosis not present

## 2023-04-25 DIAGNOSIS — F411 Generalized anxiety disorder: Secondary | ICD-10-CM | POA: Diagnosis not present

## 2023-04-25 DIAGNOSIS — F502 Bulimia nervosa: Secondary | ICD-10-CM | POA: Diagnosis not present

## 2023-04-25 DIAGNOSIS — F900 Attention-deficit hyperactivity disorder, predominantly inattentive type: Secondary | ICD-10-CM | POA: Diagnosis not present

## 2023-05-02 DIAGNOSIS — R531 Weakness: Secondary | ICD-10-CM | POA: Diagnosis not present

## 2023-05-02 DIAGNOSIS — M25511 Pain in right shoulder: Secondary | ICD-10-CM | POA: Diagnosis not present

## 2023-05-02 DIAGNOSIS — M25611 Stiffness of right shoulder, not elsewhere classified: Secondary | ICD-10-CM | POA: Diagnosis not present

## 2023-05-03 ENCOUNTER — Ambulatory Visit: Payer: BC Managed Care – PPO | Admitting: Orthopedic Surgery

## 2023-05-09 DIAGNOSIS — M25511 Pain in right shoulder: Secondary | ICD-10-CM | POA: Diagnosis not present

## 2023-05-09 DIAGNOSIS — M25611 Stiffness of right shoulder, not elsewhere classified: Secondary | ICD-10-CM | POA: Diagnosis not present

## 2023-05-09 DIAGNOSIS — R531 Weakness: Secondary | ICD-10-CM | POA: Diagnosis not present

## 2023-05-23 DIAGNOSIS — R531 Weakness: Secondary | ICD-10-CM | POA: Diagnosis not present

## 2023-05-23 DIAGNOSIS — M25511 Pain in right shoulder: Secondary | ICD-10-CM | POA: Diagnosis not present

## 2023-05-23 DIAGNOSIS — M25611 Stiffness of right shoulder, not elsewhere classified: Secondary | ICD-10-CM | POA: Diagnosis not present

## 2023-05-24 ENCOUNTER — Encounter: Payer: Self-pay | Admitting: Orthopedic Surgery

## 2023-05-24 ENCOUNTER — Ambulatory Visit: Payer: BC Managed Care – PPO | Admitting: Orthopedic Surgery

## 2023-05-24 ENCOUNTER — Other Ambulatory Visit: Payer: Self-pay

## 2023-05-24 DIAGNOSIS — M25511 Pain in right shoulder: Secondary | ICD-10-CM

## 2023-05-24 DIAGNOSIS — M19011 Primary osteoarthritis, right shoulder: Secondary | ICD-10-CM | POA: Diagnosis not present

## 2023-05-24 MED ORDER — MELOXICAM 15 MG PO TABS: 15.0000 mg | ORAL_TABLET | Freq: Every day | ORAL | 0 refills | Status: DC

## 2023-05-24 NOTE — Progress Notes (Signed)
Office Visit Note   Patient: Christy Cooper           Date of Birth: 04-25-1976           MRN: [id number] Visit Date: 05/24/2023 Requested by: Sigmund Hazel, MD [address] [place],  [place] [address] PCP: Sigmund Hazel, MD  Subjective: Chief Complaint  Patient presents with   Right Shoulder - Follow-up    HPI: Christy Cooper is a 47 y.o. female who presents to the office reporting right shoulder pain.  Since she was last seen she has been to 26 physical therapy visits.  Initial surgery was about 4 months ago.  She has made good progress.  Decision point today was for or against intra-articular injection to facilitate regaining range of motion.  She was working with [place] out at [facility]..                ROS: All systems reviewed are negative as they relate to the chief complaint within the history of present illness.  Patient denies fevers or chills.  Assessment & Plan: Visit Diagnoses: No diagnosis found.  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  She was on vacation last week so the shoulder is a little bit stiffer now but overall she has significantly more motion than she had previously.  Continue with home exercise program.  Taking Mobic every other day for another 4 to 6 weeks I think would help her regain her full range of motion.  Follow-up with [place] as needed.  Follow-Up Instructions: No follow-ups on file.   Orders:  No orders of the defined types were placed in this encounter.  No orders of the defined types were placed in this encounter.     Procedures: No procedures performed   Clinical Data: No additional findings.  Objective: Vital Signs: There were no vitals taken for this visit.  Physical Exam:  Constitutional: Patient appears well-developed HEENT:  Head: Normocephalic Eyes:EOM are normal Neck: Normal range of motion Cardiovascular: Normal rate Pulmonary/chest: Effort normal Neurologic: Patient is  alert Skin: Skin is warm Psychiatric: Patient has normal mood and affect  Ortho Exam: Ortho exam demonstrates range of motion of 45/90/160 on the right.  Rotator cuff strength is good.  Overall the shoulder motion has improved compared to prior visit.  Specialty Comments:  No specialty comments available.  Imaging: No results found.   PMFS History: Patient Active Problem List   Diagnosis Date Noted   SOBOE (shortness of breath on exertion) 10/24/2022   Low vitamin B12 level 10/24/2022   Mixed hyperlipidemia 10/24/2022   Abnormal craving 09/26/2022   Vitamin D deficiency 09/26/2022   Generalized obesity 09/26/2022   Mood disorder (HCC)- emotional eating 04/19/2022   Past Medical History:  Diagnosis Date   Perimenopause     Family History  Problem Relation Age of Onset   Hypertension Father    Diabetes Father    Kidney disease Father     Past Surgical History:  Procedure Laterality Date   CLAVICLE SURGERY  1990   NECK SURGERY  2007   herniated disc   TONSILLECTOMY  1983   Social History   Occupational History   Occupation: Window and IT consultant  Tobacco Use   Smoking status: Former   Smokeless tobacco: Never  Substance and Sexual Activity   Alcohol use: Not on file   Drug use: Not on file   Sexual activity: Not on file

## 2023-06-27 DIAGNOSIS — U071 COVID-19: Secondary | ICD-10-CM | POA: Diagnosis not present

## 2023-06-27 DIAGNOSIS — M79621 Pain in right upper arm: Secondary | ICD-10-CM | POA: Diagnosis not present

## 2023-06-27 DIAGNOSIS — S80261A Insect bite (nonvenomous), right knee, initial encounter: Secondary | ICD-10-CM | POA: Diagnosis not present

## 2023-06-27 DIAGNOSIS — Z20822 Contact with and (suspected) exposure to covid-19: Secondary | ICD-10-CM | POA: Diagnosis not present

## 2023-06-27 DIAGNOSIS — R051 Acute cough: Secondary | ICD-10-CM | POA: Diagnosis not present

## 2023-07-20 DIAGNOSIS — F5081 Binge eating disorder: Secondary | ICD-10-CM | POA: Diagnosis not present

## 2023-07-20 DIAGNOSIS — F411 Generalized anxiety disorder: Secondary | ICD-10-CM | POA: Diagnosis not present

## 2023-07-20 DIAGNOSIS — F900 Attention-deficit hyperactivity disorder, predominantly inattentive type: Secondary | ICD-10-CM | POA: Diagnosis not present

## 2023-08-17 DIAGNOSIS — F411 Generalized anxiety disorder: Secondary | ICD-10-CM | POA: Diagnosis not present

## 2023-08-17 DIAGNOSIS — F900 Attention-deficit hyperactivity disorder, predominantly inattentive type: Secondary | ICD-10-CM | POA: Diagnosis not present

## 2023-09-11 DIAGNOSIS — F900 Attention-deficit hyperactivity disorder, predominantly inattentive type: Secondary | ICD-10-CM | POA: Diagnosis not present

## 2023-09-11 DIAGNOSIS — F411 Generalized anxiety disorder: Secondary | ICD-10-CM | POA: Diagnosis not present

## 2023-10-02 DIAGNOSIS — N959 Unspecified menopausal and perimenopausal disorder: Secondary | ICD-10-CM | POA: Diagnosis not present

## 2023-10-02 DIAGNOSIS — Z780 Asymptomatic menopausal state: Secondary | ICD-10-CM | POA: Diagnosis not present

## 2023-11-20 DIAGNOSIS — F411 Generalized anxiety disorder: Secondary | ICD-10-CM | POA: Diagnosis not present

## 2023-11-20 DIAGNOSIS — F900 Attention-deficit hyperactivity disorder, predominantly inattentive type: Secondary | ICD-10-CM | POA: Diagnosis not present

## 2023-12-18 DIAGNOSIS — F411 Generalized anxiety disorder: Secondary | ICD-10-CM | POA: Diagnosis not present

## 2023-12-18 DIAGNOSIS — F900 Attention-deficit hyperactivity disorder, predominantly inattentive type: Secondary | ICD-10-CM | POA: Diagnosis not present

## 2024-01-04 DIAGNOSIS — F520 Hypoactive sexual desire disorder: Secondary | ICD-10-CM | POA: Diagnosis not present

## 2024-01-04 DIAGNOSIS — N959 Unspecified menopausal and perimenopausal disorder: Secondary | ICD-10-CM | POA: Diagnosis not present

## 2024-01-18 DIAGNOSIS — F411 Generalized anxiety disorder: Secondary | ICD-10-CM | POA: Diagnosis not present

## 2024-01-18 DIAGNOSIS — F900 Attention-deficit hyperactivity disorder, predominantly inattentive type: Secondary | ICD-10-CM | POA: Diagnosis not present

## 2024-02-14 DIAGNOSIS — Z30433 Encounter for removal and reinsertion of intrauterine contraceptive device: Secondary | ICD-10-CM | POA: Diagnosis not present

## 2024-02-14 DIAGNOSIS — Z3202 Encounter for pregnancy test, result negative: Secondary | ICD-10-CM | POA: Diagnosis not present

## 2024-03-07 DIAGNOSIS — F411 Generalized anxiety disorder: Secondary | ICD-10-CM | POA: Diagnosis not present

## 2024-03-07 DIAGNOSIS — F900 Attention-deficit hyperactivity disorder, predominantly inattentive type: Secondary | ICD-10-CM | POA: Diagnosis not present

## 2024-03-13 DIAGNOSIS — Z0000 Encounter for general adult medical examination without abnormal findings: Secondary | ICD-10-CM | POA: Diagnosis not present

## 2024-03-13 DIAGNOSIS — Z23 Encounter for immunization: Secondary | ICD-10-CM | POA: Diagnosis not present

## 2024-03-13 DIAGNOSIS — E669 Obesity, unspecified: Secondary | ICD-10-CM | POA: Diagnosis not present

## 2024-03-13 DIAGNOSIS — E559 Vitamin D deficiency, unspecified: Secondary | ICD-10-CM | POA: Diagnosis not present

## 2024-05-07 ENCOUNTER — Ambulatory Visit: Admitting: Family Medicine

## 2024-06-24 ENCOUNTER — Other Ambulatory Visit (INDEPENDENT_AMBULATORY_CARE_PROVIDER_SITE_OTHER): Payer: Self-pay

## 2024-06-24 ENCOUNTER — Ambulatory Visit: Admitting: Orthopedic Surgery

## 2024-06-24 ENCOUNTER — Encounter: Payer: Self-pay | Admitting: Orthopedic Surgery

## 2024-06-24 DIAGNOSIS — M25552 Pain in left hip: Secondary | ICD-10-CM | POA: Diagnosis not present

## 2024-06-24 MED ORDER — MELOXICAM 15 MG PO TABS: ORAL_TABLET | ORAL | 0 refills | Status: DC

## 2024-06-24 NOTE — Progress Notes (Signed)
 Office Visit Note   Patient: Christy Cooper           Date of Birth: 02-Nov-1976           MRN: [id number] Visit Date: 06/24/2024 Requested by: Cleotilde Planas, MD [address] [place],  [place] [address] PCP: Cleotilde Planas, MD  Subjective: Chief Complaint  Patient presents with   Left Hip - Pain    HPI: Christy Cooper is a 48 y.o. female who presents to the office reporting left hip pain of 6 months duration.  Denies any history of injury.  Patient reports groin pain.  She states that when she does lunges and squats it is very hard for her about 2 hours after that.  She feels like there is some degree of swelling around that left hip region.  Does report painful range of motion.  Does not really take much medication.  She walks about 5 8000 steps a day.  Does describe night pain as well.  Clicking started last month.  Pain stays in the groin and does not radiate posteriorly and does not radiate down that left leg.  She has stopped doing lower body training workouts.  Stairs are okay for her to get up and down.  Does report some stiffness in the hip in the mornings and then it normalizes..                ROS: All systems reviewed are negative as they relate to the chief complaint within the history of present illness.  Patient denies fevers or chills.  Assessment & Plan: Visit Diagnoses:  1. 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  We talked about 3 levels of intervention which would be anti-inflammatories, MRIs , or injection.  In general think she does have some degree of femoral acetabular impingement.  Whether or not this reaches the level or threshold for any type of operative intervention is hard to say.  Would like to get MRI scan of the pelvis to evaluate left hip femoral acetabular impingement and labral tear.  Mobic  prescribed for 2 weeks.  Could consider intra-articular injection as an  initial step of pain relief depending on the severity of the labral pathology.  Follow-Up Instructions: No follow-ups on file.   Orders:  Orders Placed This Encounter  Procedures   XR HIP UNILAT W OR W/O PELVIS 2-3 VIEWS LEFT   MR Pelvis w/o contrast   Meds ordered this encounter  Medications   meloxicam  (MOBIC ) 15 MG tablet    Sig: 1 po q d x 2 weeks then prn    Dispense:  30 tablet    Refill:  0      Procedures: No procedures performed   Clinical Data: No additional findings.  Objective: Vital Signs: There were no vitals taken for this visit.  Physical Exam:  Constitutional: Patient appears well-developed HEENT:  Head: Normocephalic Eyes:EOM are normal Neck: Normal range of motion Cardiovascular: Normal rate Pulmonary/chest: Effort normal Neurologic: Patient is alert Skin: Skin is warm Psychiatric: Patient has normal mood and affect  Ortho Exam: Ortho exam demonstrates normal gait and alignment.  No real groin pain with internal/external rotation of either leg.  Patient states the hip feels a little bit tight with max internal rotation on the left.  Has very good hip flexion abduction adduction strength.  No popping or clicking with hip motion going from hip  flexion and internal rotation into hip extension.  Specialty Comments:  No specialty comments available.  Imaging: No results found.   PMFS History: Patient Active Problem List   Diagnosis Date Noted   SOBOE (shortness of breath on exertion) 10/24/2022   Low vitamin B12 level 10/24/2022   Mixed hyperlipidemia 10/24/2022   Abnormal craving 09/26/2022   Vitamin D  deficiency 09/26/2022   Generalized obesity 09/26/2022   Mood disorder (HCC)- emotional eating 04/19/2022   Past Medical History:  Diagnosis Date   Perimenopause     Family History  Problem Relation Age of Onset   Hypertension Father    Diabetes Father    Kidney disease Father     Past Surgical History:  Procedure Laterality Date    CLAVICLE SURGERY  1990   NECK SURGERY  2007   herniated disc   TONSILLECTOMY  1983   Social History   Occupational History   Occupation: Window and IT consultant  Tobacco Use   Smoking status: Former   Smokeless tobacco: Never  Substance and Sexual Activity   Alcohol use: Not on file   Drug use: Not on file   Sexual activity: Not on file

## 2024-06-25 ENCOUNTER — Encounter: Payer: Self-pay | Admitting: Orthopedic Surgery

## 2024-06-28 ENCOUNTER — Ambulatory Visit: Admitting: Podiatry

## 2024-06-28 DIAGNOSIS — B070 Plantar wart: Secondary | ICD-10-CM | POA: Diagnosis not present

## 2024-06-28 MED ORDER — FLUOROURACIL 5 % EX CREA: TOPICAL_CREAM | Freq: Two times a day (BID) | CUTANEOUS | 2 refills | Status: AC

## 2024-06-28 NOTE — Progress Notes (Signed)
 Subjective:   Patient ID: Christy Cooper, female   DOB: 48 y.o.   MRN: [id number]   HPI Patient presents with painful lesions on the plantar aspect of the left foot that has occurred over the last couple months and is hard to walk on.  She is tried over-the-counter medicine without relief and patient does not smoke likes to be active   Review of Systems  All other systems reviewed and are negative.       Objective:  Physical Exam Vitals and nursing note reviewed.  Constitutional:      Appearance: She is well-developed.  Pulmonary:     Effort: Pulmonary effort is normal.  Musculoskeletal:        General: Normal range of motion.  Skin:    General: Skin is warm.  Neurological:     Mental Status: She is alert.     Neurovascular status intact muscle strength found to be adequate range of motion within normal limits with keratotic lesion subfirst metatarsal left measuring about 7 x 7 mm and small lesion left hallux measuring 3 x 3 mm.  Good digital perfusion well-oriented x 3     Assessment:  Verruca plantaris plantar aspect left foot with pain     Plan:  H&P reviewed and went ahead today and debrided lesions fully apply chemical agent to create immune response with sterile dressing and reappoint to recheck.  Explained what to do if blistering were to occur and placed on Efudex 

## 2024-07-27 ENCOUNTER — Other Ambulatory Visit: Payer: Self-pay | Admitting: Orthopedic Surgery

## 2024-07-29 DIAGNOSIS — F411 Generalized anxiety disorder: Secondary | ICD-10-CM | POA: Diagnosis not present

## 2024-07-29 DIAGNOSIS — F900 Attention-deficit hyperactivity disorder, predominantly inattentive type: Secondary | ICD-10-CM | POA: Diagnosis not present

## 2024-08-07 DIAGNOSIS — E669 Obesity, unspecified: Secondary | ICD-10-CM | POA: Diagnosis not present

## 2024-08-07 DIAGNOSIS — Z6830 Body mass index (BMI) 30.0-30.9, adult: Secondary | ICD-10-CM | POA: Diagnosis not present

## 2024-08-16 ENCOUNTER — Ambulatory Visit (INDEPENDENT_AMBULATORY_CARE_PROVIDER_SITE_OTHER): Admitting: Podiatry

## 2024-08-16 ENCOUNTER — Encounter: Payer: Self-pay | Admitting: Podiatry

## 2024-08-16 DIAGNOSIS — B070 Plantar wart: Secondary | ICD-10-CM

## 2024-08-20 DIAGNOSIS — F900 Attention-deficit hyperactivity disorder, predominantly inattentive type: Secondary | ICD-10-CM | POA: Diagnosis not present

## 2024-08-20 DIAGNOSIS — F411 Generalized anxiety disorder: Secondary | ICD-10-CM | POA: Diagnosis not present

## 2024-09-10 DIAGNOSIS — F900 Attention-deficit hyperactivity disorder, predominantly inattentive type: Secondary | ICD-10-CM | POA: Diagnosis not present

## 2024-09-10 DIAGNOSIS — F411 Generalized anxiety disorder: Secondary | ICD-10-CM | POA: Diagnosis not present

## 2024-09-16 ENCOUNTER — Encounter: Payer: Self-pay | Admitting: Radiology

## 2024-10-10 NOTE — Progress Notes (Signed)
 Subjective:   Patient ID: Christy Cooper, female   DOB: 48 y.o.   MRN: [id number]   HPI Patient presents with plantar lesion left that is improving but still sore   ROS      Objective:  Physical Exam  Neurovascular status intact with lesion plantar left that is tender with pinpoint bleeding pain to lateral pressure     Assessment:  Verruca plantaris plantar left     Plan:  H&P reviewed condition sterile sharp debridement of lesion and applied chemical agent to create immune response and explained what to do if any blistering were to occur

## 2024-10-30 DIAGNOSIS — F900 Attention-deficit hyperactivity disorder, predominantly inattentive type: Secondary | ICD-10-CM | POA: Diagnosis not present

## 2024-10-30 DIAGNOSIS — F411 Generalized anxiety disorder: Secondary | ICD-10-CM | POA: Diagnosis not present
